# Patient Record
Sex: Male | Born: 1935 | Hispanic: Yes | Smoking: Former smoker
Health system: Southern US, Community
[De-identification: ages and names within clinical notes are randomized; demographics above are authoritative.]

## PROBLEM LIST (undated history)

## (undated) DIAGNOSIS — E119 Type 2 diabetes mellitus without complications: Secondary | ICD-10-CM

## (undated) DIAGNOSIS — I739 Peripheral vascular disease, unspecified: Secondary | ICD-10-CM

## (undated) DIAGNOSIS — I10 Essential (primary) hypertension: Secondary | ICD-10-CM

## (undated) DIAGNOSIS — F0390 Unspecified dementia without behavioral disturbance: Secondary | ICD-10-CM

## (undated) DIAGNOSIS — N189 Chronic kidney disease, unspecified: Secondary | ICD-10-CM

## (undated) HISTORY — PX: ABOVE KNEE LEG AMPUTATION: SUR20

## (undated) HISTORY — DX: Essential (primary) hypertension: I10

## (undated) HISTORY — DX: Unspecified dementia, unspecified severity, without behavioral disturbance, psychotic disturbance, mood disturbance, and anxiety: F03.90

## (undated) HISTORY — DX: Type 2 diabetes mellitus without complications: E11.9

## (undated) HISTORY — DX: Peripheral vascular disease, unspecified: I73.9

## (undated) HISTORY — DX: Chronic kidney disease, unspecified: N18.9

## (undated) HISTORY — PX: TRICEPS TENDON REPAIR: SHX2577

---

## 2005-08-21 ENCOUNTER — Ambulatory Visit: Payer: Self-pay | Admitting: Orthopaedic Surgery

## 2005-09-02 ENCOUNTER — Other Ambulatory Visit: Payer: Self-pay

## 2005-09-02 ENCOUNTER — Ambulatory Visit: Payer: Self-pay | Admitting: Orthopaedic Surgery

## 2005-09-07 ENCOUNTER — Ambulatory Visit: Payer: Self-pay | Admitting: Orthopaedic Surgery

## 2007-10-21 ENCOUNTER — Other Ambulatory Visit: Payer: Self-pay

## 2007-10-22 ENCOUNTER — Other Ambulatory Visit: Payer: Self-pay

## 2007-10-22 ENCOUNTER — Inpatient Hospital Stay: Payer: Self-pay | Admitting: Internal Medicine

## 2010-01-24 ENCOUNTER — Emergency Department: Payer: Self-pay | Admitting: Emergency Medicine

## 2011-01-05 ENCOUNTER — Encounter: Payer: Self-pay | Admitting: Family Medicine

## 2011-01-19 ENCOUNTER — Encounter: Payer: Self-pay | Admitting: Family Medicine

## 2011-02-19 ENCOUNTER — Encounter: Payer: Self-pay | Admitting: Family Medicine

## 2012-02-11 ENCOUNTER — Emergency Department: Payer: Self-pay | Admitting: *Deleted

## 2012-02-11 LAB — TROPONIN I: Troponin-I: 0.07 ng/mL — ABNORMAL HIGH

## 2012-02-11 LAB — CBC
HCT: 43.8 % (ref 40.0–52.0)
MCH: 30.2 pg (ref 26.0–34.0)
MCHC: 32.6 g/dL (ref 32.0–36.0)
Platelet: 205 10*3/uL (ref 150–440)
RBC: 4.73 10*6/uL (ref 4.40–5.90)
RDW: 13.5 % (ref 11.5–14.5)

## 2012-02-11 LAB — BASIC METABOLIC PANEL
Anion Gap: 11 (ref 7–16)
BUN: 26 mg/dL — ABNORMAL HIGH (ref 7–18)
Calcium, Total: 8.2 mg/dL — ABNORMAL LOW (ref 8.5–10.1)
Co2: 23 mmol/L (ref 21–32)
Creatinine: 2.1 mg/dL — ABNORMAL HIGH (ref 0.60–1.30)
EGFR (African American): 32 — ABNORMAL LOW
Osmolality: 286 (ref 275–301)
Sodium: 140 mmol/L (ref 136–145)

## 2012-02-11 LAB — CK TOTAL AND CKMB (NOT AT ARMC): CK-MB: 4.7 ng/mL — ABNORMAL HIGH (ref 0.5–3.6)

## 2013-10-04 ENCOUNTER — Ambulatory Visit: Payer: Self-pay | Admitting: Orthopedic Surgery

## 2013-10-25 ENCOUNTER — Emergency Department: Payer: Self-pay | Admitting: Emergency Medicine

## 2013-10-25 LAB — COMPREHENSIVE METABOLIC PANEL
ALBUMIN: 3.3 g/dL — AB (ref 3.4–5.0)
ANION GAP: 5 — AB (ref 7–16)
AST: 34 U/L (ref 15–37)
Alkaline Phosphatase: 74 U/L
BILIRUBIN TOTAL: 0.7 mg/dL (ref 0.2–1.0)
BUN: 43 mg/dL — ABNORMAL HIGH (ref 7–18)
CO2: 21 mmol/L (ref 21–32)
Calcium, Total: 8.7 mg/dL (ref 8.5–10.1)
Chloride: 114 mmol/L — ABNORMAL HIGH (ref 98–107)
Creatinine: 2.68 mg/dL — ABNORMAL HIGH (ref 0.60–1.30)
EGFR (Non-African Amer.): 20 — ABNORMAL LOW
GFR CALC AF AMER: 24 — AB
Glucose: 144 mg/dL — ABNORMAL HIGH (ref 65–99)
OSMOLALITY: 293 (ref 275–301)
Potassium: 4.7 mmol/L (ref 3.5–5.1)
SGPT (ALT): 40 U/L (ref 12–78)
Sodium: 140 mmol/L (ref 136–145)
TOTAL PROTEIN: 6.6 g/dL (ref 6.4–8.2)

## 2013-10-25 LAB — CBC
HCT: 39.3 % — AB (ref 40.0–52.0)
HGB: 13.1 g/dL (ref 13.0–18.0)
MCH: 31.4 pg (ref 26.0–34.0)
MCHC: 33.4 g/dL (ref 32.0–36.0)
MCV: 94 fL (ref 80–100)
PLATELETS: 189 10*3/uL (ref 150–440)
RBC: 4.19 10*6/uL — ABNORMAL LOW (ref 4.40–5.90)
RDW: 14.9 % — ABNORMAL HIGH (ref 11.5–14.5)
WBC: 7.7 10*3/uL (ref 3.8–10.6)

## 2013-10-25 LAB — CK TOTAL AND CKMB (NOT AT ARMC)
CK, TOTAL: 139 U/L
CK-MB: 5.6 ng/mL — ABNORMAL HIGH (ref 0.5–3.6)

## 2013-10-25 LAB — TROPONIN I: Troponin-I: 0.16 ng/mL — ABNORMAL HIGH

## 2013-10-25 LAB — APTT: Activated PTT: 27.8 secs (ref 23.6–35.9)

## 2013-10-25 LAB — PROTIME-INR
INR: 1.1
Prothrombin Time: 13.7 secs (ref 11.5–14.7)

## 2013-11-12 ENCOUNTER — Ambulatory Visit: Payer: Self-pay | Admitting: Vascular Surgery

## 2013-11-12 LAB — BASIC METABOLIC PANEL
Anion Gap: 2 — ABNORMAL LOW (ref 7–16)
BUN: 34 mg/dL — ABNORMAL HIGH (ref 7–18)
CALCIUM: 8.8 mg/dL (ref 8.5–10.1)
CO2: 28 mmol/L (ref 21–32)
CREATININE: 2.32 mg/dL — AB (ref 0.60–1.30)
Chloride: 116 mmol/L — ABNORMAL HIGH (ref 98–107)
EGFR (African American): 28 — ABNORMAL LOW
GFR CALC NON AF AMER: 24 — AB
GLUCOSE: 114 mg/dL — AB (ref 65–99)
Osmolality: 299 (ref 275–301)
POTASSIUM: 5.4 mmol/L — AB (ref 3.5–5.1)
SODIUM: 146 mmol/L — AB (ref 136–145)

## 2014-11-19 ENCOUNTER — Ambulatory Visit
Admit: 2014-11-19 | Disposition: A | Discharge status: Home / Self Care | Payer: Self-pay | Attending: Internal Medicine | Admitting: Internal Medicine

## 2014-12-12 ENCOUNTER — Inpatient Hospital Stay
Admit: 2014-12-12 | Disposition: A | Discharge status: Skilled Nursing Facility | Payer: Self-pay | Attending: Internal Medicine | Admitting: Internal Medicine

## 2014-12-12 LAB — CBC
HCT: 34.5 % — AB (ref 40.0–52.0)
HGB: 11.2 g/dL — AB (ref 13.0–18.0)
MCH: 30.5 pg (ref 26.0–34.0)
MCHC: 32.3 g/dL (ref 32.0–36.0)
MCV: 94 fL (ref 80–100)
Platelet: 309 10*3/uL (ref 150–440)
RBC: 3.66 10*6/uL — AB (ref 4.40–5.90)
RDW: 13.2 % (ref 11.5–14.5)
WBC: 9.5 10*3/uL (ref 3.8–10.6)

## 2014-12-12 LAB — LACTIC ACID, PLASMA: Lactic Acid, Venous: 1.2 mmol/L

## 2014-12-12 LAB — COMPREHENSIVE METABOLIC PANEL
ALBUMIN: 3.2 g/dL — AB
ALK PHOS: 71 U/L
ALT: 15 U/L — AB
Anion Gap: 8 (ref 7–16)
BILIRUBIN TOTAL: 0.5 mg/dL
BUN: 61 mg/dL — AB
CHLORIDE: 116 mmol/L — AB
CREATININE: 2.71 mg/dL — AB
Calcium, Total: 8.7 mg/dL — ABNORMAL LOW
Co2: 19 mmol/L — ABNORMAL LOW
EGFR (African American): 23 — ABNORMAL LOW
EGFR (Non-African Amer.): 20 — ABNORMAL LOW
Glucose: 126 mg/dL — ABNORMAL HIGH
Potassium: 5.2 mmol/L — ABNORMAL HIGH
SGOT(AST): 20 U/L
Sodium: 143 mmol/L
TOTAL PROTEIN: 6.5 g/dL

## 2014-12-13 LAB — BASIC METABOLIC PANEL
Anion Gap: 5 — ABNORMAL LOW (ref 7–16)
Anion Gap: 6 — ABNORMAL LOW (ref 7–16)
BUN: 56 mg/dL — ABNORMAL HIGH
BUN: 52 mg/dL — ABNORMAL HIGH
CALCIUM: 8.7 mg/dL — AB
CALCIUM: 8.5 mg/dL — AB
CHLORIDE: 117 mmol/L — AB
CHLORIDE: 115 mmol/L — AB
CREATININE: 2.67 mg/dL — AB
CREATININE: 2.47 mg/dL — AB
Co2: 23 mmol/L
Co2: 22 mmol/L
EGFR (African American): 24 — ABNORMAL LOW
EGFR (African American): 26 — ABNORMAL LOW
EGFR (Non-African Amer.): 20 — ABNORMAL LOW
EGFR (Non-African Amer.): 22 — ABNORMAL LOW
Glucose: 104 mg/dL — ABNORMAL HIGH
Glucose: 112 mg/dL — ABNORMAL HIGH
Potassium: 5.3 mmol/L — ABNORMAL HIGH
Potassium: 5.2 mmol/L — ABNORMAL HIGH
Sodium: 145 mmol/L
Sodium: 143 mmol/L

## 2014-12-13 LAB — CK: CK, TOTAL: 158 U/L

## 2014-12-13 LAB — HEMOGLOBIN: HGB: 11.3 g/dL — AB (ref 13.0–18.0)

## 2014-12-14 LAB — CBC WITH DIFFERENTIAL/PLATELET
Basophil #: 0 10*3/uL (ref 0.0–0.1)
Basophil %: 0.4 %
Eosinophil #: 0 10*3/uL (ref 0.0–0.7)
Eosinophil %: 0.2 %
HCT: 35.8 % — AB (ref 40.0–52.0)
HGB: 11.7 g/dL — AB (ref 13.0–18.0)
LYMPHS PCT: 13.2 %
Lymphocyte #: 1.3 10*3/uL (ref 1.0–3.6)
MCH: 30.8 pg (ref 26.0–34.0)
MCHC: 32.8 g/dL (ref 32.0–36.0)
MCV: 94 fL (ref 80–100)
MONOS PCT: 6.4 %
Monocyte #: 0.6 x10 3/mm (ref 0.2–1.0)
NEUTROS PCT: 79.8 %
Neutrophil #: 7.9 10*3/uL — ABNORMAL HIGH (ref 1.4–6.5)
PLATELETS: 274 10*3/uL (ref 150–440)
RBC: 3.81 10*6/uL — ABNORMAL LOW (ref 4.40–5.90)
RDW: 13.2 % (ref 11.5–14.5)
WBC: 9.9 10*3/uL (ref 3.8–10.6)

## 2014-12-14 LAB — BASIC METABOLIC PANEL
ANION GAP: 9 (ref 7–16)
BUN: 52 mg/dL — AB
Calcium, Total: 8.8 mg/dL — ABNORMAL LOW
Chloride: 114 mmol/L — ABNORMAL HIGH
Co2: 22 mmol/L
Glucose: 130 mg/dL — ABNORMAL HIGH
Potassium: 5.2 mmol/L — ABNORMAL HIGH
Sodium: 145 mmol/L

## 2014-12-14 LAB — CREATININE, SERUM
Creatinine: 2.32 mg/dL — ABNORMAL HIGH
GFR CALC AF AMER: 28 — AB
GFR CALC NON AF AMER: 24 — AB

## 2014-12-15 LAB — CO2, TOTAL: Co2: 24 mmol/L

## 2014-12-15 LAB — POTASSIUM: Potassium: 4.4 mmol/L

## 2014-12-15 LAB — CLOSTRIDIUM DIFFICILE(ARMC)

## 2014-12-15 LAB — CREATININE, SERUM
Creatinine: 2.34 mg/dL — ABNORMAL HIGH
EGFR (African American): 28 — ABNORMAL LOW
EGFR (Non-African Amer.): 24 — ABNORMAL LOW

## 2014-12-16 LAB — BASIC METABOLIC PANEL
ANION GAP: 11 (ref 7–16)
BUN: 48 mg/dL — ABNORMAL HIGH
CALCIUM: 8.4 mg/dL — AB
CHLORIDE: 116 mmol/L — AB
Co2: 25 mmol/L
Creatinine: 2.26 mg/dL — ABNORMAL HIGH
EGFR (African American): 29 — ABNORMAL LOW
EGFR (Non-African Amer.): 25 — ABNORMAL LOW
GLUCOSE: 127 mg/dL — AB
Potassium: 3.7 mmol/L
SODIUM: 152 mmol/L — AB

## 2014-12-16 LAB — VANCOMYCIN, TROUGH: Vancomycin, Trough: 8 ug/mL — ABNORMAL LOW

## 2014-12-17 LAB — BASIC METABOLIC PANEL
Anion Gap: 8 (ref 7–16)
BUN: 46 mg/dL — ABNORMAL HIGH
Calcium, Total: 8.1 mg/dL — ABNORMAL LOW
Chloride: 112 mmol/L — ABNORMAL HIGH
Co2: 25 mmol/L
Creatinine: 2.22 mg/dL — ABNORMAL HIGH
EGFR (African American): 30 — ABNORMAL LOW
EGFR (Non-African Amer.): 26 — ABNORMAL LOW
Glucose: 117 mg/dL — ABNORMAL HIGH
Potassium: 3.8 mmol/L
SODIUM: 145 mmol/L

## 2014-12-17 LAB — CULTURE, BLOOD (SINGLE)

## 2014-12-18 LAB — BASIC METABOLIC PANEL
Anion Gap: 10 (ref 7–16)
BUN: 45 mg/dL — AB
CHLORIDE: 112 mmol/L — AB
CREATININE: 2.25 mg/dL — AB
Calcium, Total: 8 mg/dL — ABNORMAL LOW
Co2: 24 mmol/L
GFR CALC AF AMER: 29 — AB
GFR CALC NON AF AMER: 25 — AB
GLUCOSE: 108 mg/dL — AB
Potassium: 4 mmol/L
Sodium: 146 mmol/L — ABNORMAL HIGH

## 2014-12-18 LAB — VANCOMYCIN, TROUGH: VANCOMYCIN, TROUGH: 14 ug/mL

## 2014-12-18 LAB — PLATELET COUNT: Platelet: 266 10*3/uL (ref 150–440)

## 2014-12-20 ENCOUNTER — Ambulatory Visit
Admit: 2014-12-20 | Disposition: A | Discharge status: Home / Self Care | Payer: Self-pay | Attending: Internal Medicine | Admitting: Internal Medicine

## 2014-12-20 LAB — BASIC METABOLIC PANEL
ANION GAP: 8 (ref 7–16)
BUN: 44 mg/dL — ABNORMAL HIGH
CALCIUM: 8.3 mg/dL — AB
CHLORIDE: 117 mmol/L — AB
Co2: 23 mmol/L
Creatinine: 2.08 mg/dL — ABNORMAL HIGH
GFR CALC AF AMER: 32 — AB
GFR CALC NON AF AMER: 28 — AB
Glucose: 124 mg/dL — ABNORMAL HIGH
Potassium: 4.5 mmol/L
SODIUM: 148 mmol/L — AB

## 2014-12-20 LAB — CBC WITH DIFFERENTIAL/PLATELET
BASOS ABS: 0 10*3/uL (ref 0.0–0.1)
Basophil %: 0.4 %
Eosinophil #: 0.1 10*3/uL (ref 0.0–0.7)
Eosinophil %: 0.7 %
HCT: 36.6 % — AB (ref 40.0–52.0)
HGB: 11.9 g/dL — ABNORMAL LOW (ref 13.0–18.0)
Lymphocyte #: 1.2 10*3/uL (ref 1.0–3.6)
Lymphocyte %: 10.5 %
MCH: 30.6 pg (ref 26.0–34.0)
MCHC: 32.6 g/dL (ref 32.0–36.0)
MCV: 94 fL (ref 80–100)
MONOS PCT: 6.5 %
Monocyte #: 0.8 x10 3/mm (ref 0.2–1.0)
NEUTROS ABS: 9.8 10*3/uL — AB (ref 1.4–6.5)
Neutrophil %: 81.9 %
Platelet: 264 10*3/uL (ref 150–440)
RBC: 3.9 10*6/uL — ABNORMAL LOW (ref 4.40–5.90)
RDW: 12.8 % (ref 11.5–14.5)
WBC: 11.9 10*3/uL — AB (ref 3.8–10.6)

## 2014-12-20 LAB — VANCOMYCIN, TROUGH: Vancomycin, Trough: 20 ug/mL

## 2014-12-21 LAB — CBC WITH DIFFERENTIAL/PLATELET
BASOS ABS: 0.1 10*3/uL (ref 0.0–0.1)
Basophil %: 0.4 %
Eosinophil #: 0 10*3/uL (ref 0.0–0.7)
Eosinophil %: 0.2 %
HCT: 35.7 % — ABNORMAL LOW (ref 40.0–52.0)
HGB: 11.4 g/dL — ABNORMAL LOW (ref 13.0–18.0)
LYMPHS ABS: 1.1 10*3/uL (ref 1.0–3.6)
LYMPHS PCT: 7 %
MCH: 30.3 pg (ref 26.0–34.0)
MCHC: 32 g/dL (ref 32.0–36.0)
MCV: 95 fL (ref 80–100)
MONOS PCT: 6.1 %
Monocyte #: 0.9 x10 3/mm (ref 0.2–1.0)
Neutrophil #: 13.3 10*3/uL — ABNORMAL HIGH (ref 1.4–6.5)
Neutrophil %: 86.3 %
Platelet: 264 10*3/uL (ref 150–440)
RBC: 3.76 10*6/uL — ABNORMAL LOW (ref 4.40–5.90)
RDW: 13 % (ref 11.5–14.5)
WBC: 15.4 10*3/uL — AB (ref 3.8–10.6)

## 2014-12-21 LAB — BASIC METABOLIC PANEL
Anion Gap: 5 — ABNORMAL LOW (ref 7–16)
BUN: 54 mg/dL — ABNORMAL HIGH
CALCIUM: 8 mg/dL — AB
CHLORIDE: 120 mmol/L — AB
Co2: 23 mmol/L
Creatinine: 2.39 mg/dL — ABNORMAL HIGH
GFR CALC AF AMER: 27 — AB
GFR CALC NON AF AMER: 23 — AB
Glucose: 132 mg/dL — ABNORMAL HIGH
Potassium: 4.9 mmol/L
Sodium: 148 mmol/L — ABNORMAL HIGH

## 2014-12-22 LAB — CBC WITH DIFFERENTIAL/PLATELET
BASOS ABS: 0.1 10*3/uL (ref 0.0–0.1)
Basophil %: 0.5 %
Eosinophil #: 0.1 10*3/uL (ref 0.0–0.7)
Eosinophil %: 0.7 %
HCT: 32.6 % — ABNORMAL LOW (ref 40.0–52.0)
HGB: 10.7 g/dL — AB (ref 13.0–18.0)
LYMPHS PCT: 9.4 %
Lymphocyte #: 1.2 10*3/uL (ref 1.0–3.6)
MCH: 31 pg (ref 26.0–34.0)
MCHC: 33 g/dL (ref 32.0–36.0)
MCV: 94 fL (ref 80–100)
MONOS PCT: 6.8 %
Monocyte #: 0.8 x10 3/mm (ref 0.2–1.0)
NEUTROS ABS: 10.1 10*3/uL — AB (ref 1.4–6.5)
Neutrophil %: 82.6 %
Platelet: 232 10*3/uL (ref 150–440)
RBC: 3.46 10*6/uL — ABNORMAL LOW (ref 4.40–5.90)
RDW: 12.9 % (ref 11.5–14.5)
WBC: 12.2 10*3/uL — AB (ref 3.8–10.6)

## 2014-12-22 LAB — BASIC METABOLIC PANEL
ANION GAP: 4 — AB (ref 7–16)
BUN: 62 mg/dL — ABNORMAL HIGH
CALCIUM: 7.7 mg/dL — AB
CREATININE: 2.42 mg/dL — AB
Chloride: 116 mmol/L — ABNORMAL HIGH
Co2: 24 mmol/L
GFR CALC AF AMER: 27 — AB
GFR CALC NON AF AMER: 23 — AB
Glucose: 163 mg/dL — ABNORMAL HIGH
POTASSIUM: 4.7 mmol/L
Sodium: 144 mmol/L

## 2014-12-23 LAB — URINALYSIS, COMPLETE
Bilirubin,UR: NEGATIVE
Glucose,UR: NEGATIVE mg/dL (ref 0–75)
Ketone: NEGATIVE
LEUKOCYTE ESTERASE: NEGATIVE
Nitrite: NEGATIVE
Ph: 5 (ref 4.5–8.0)
Protein: 30
SPECIFIC GRAVITY: 1.017 (ref 1.003–1.030)
WBC UR: 23 /HPF (ref 0–5)

## 2014-12-24 LAB — BASIC METABOLIC PANEL
Anion Gap: 5 — ABNORMAL LOW (ref 7–16)
BUN: 54 mg/dL — AB
CHLORIDE: 119 mmol/L — AB
CREATININE: 2.02 mg/dL — AB
Calcium, Total: 7.6 mg/dL — ABNORMAL LOW
Co2: 23 mmol/L
EGFR (African American): 33 — ABNORMAL LOW
GFR CALC NON AF AMER: 29 — AB
Glucose: 138 mg/dL — ABNORMAL HIGH
Potassium: 4.8 mmol/L
Sodium: 147 mmol/L — ABNORMAL HIGH

## 2014-12-24 LAB — HEMOGLOBIN: HGB: 9.4 g/dL — AB (ref 13.0–18.0)

## 2014-12-24 LAB — WBC: WBC: 8.6 10*3/uL (ref 3.8–10.6)

## 2014-12-24 LAB — URINE CULTURE

## 2014-12-25 LAB — BASIC METABOLIC PANEL
Anion Gap: 5 — ABNORMAL LOW (ref 7–16)
BUN: 48 mg/dL — AB
CHLORIDE: 112 mmol/L — AB
Calcium, Total: 7.5 mg/dL — ABNORMAL LOW
Co2: 22 mmol/L
Creatinine: 1.8 mg/dL — ABNORMAL HIGH
EGFR (Non-African Amer.): 33 — ABNORMAL LOW
GFR CALC AF AMER: 38 — AB
GLUCOSE: 108 mg/dL — AB
Potassium: 4.7 mmol/L
Sodium: 139 mmol/L

## 2015-01-11 NOTE — Op Note (Signed)
PATIENT NAME:  Jack French, Jack French MR#:  [id number] DATE OF BIRTH:  1924-03-22  DATE OF PROCEDURE:  11/12/2013  PREOPERATIVE DIAGNOSES:  1.  Peripheral arterial disease with claudication and recurrent infection, left lower extremity.  2.  Chronic kidney disease.  3.  Coronary disease and congestive heart failure.  4.  Hypertension.   POSTOPERATIVE DIAGNOSES: 1.  Peripheral arterial disease with claudication and recurrent infection, left lower extremity.  2.  Chronic kidney disease.  3.  Coronary disease and congestive heart failure.  4.  Hypertension.   PROCEDURES:  1.  Ultrasound guidance for vascular access, right femoral artery.  2.  Catheter placement to left anterior tibial artery from right femoral approach.  3.  Aortogram and selective left lower extremity angiogram.  4.  Percutaneous transluminal angioplasty of proximal and mid superficial femoral artery with 5 mm diameter angioplasty balloon.  5.  Percutaneous transluminal angioplasty of distal superficial femoral artery and popliteal artery for a separate distinct occlusion with 5 mm diameter angioplasty balloon.  6.  Percutaneous transluminal angioplasty of left anterior tibial artery with 3 mm diameter angioplasty balloon.  7.  Self-expanding stent placement to distal left superficial femoral artery and popliteal artery for greater than 50% residual stenosis after angioplasty.  8.  StarClose closure device, right femoral artery.   SURGEON: Festus BarrenJason Edra Riccardi, M.D.   ANESTHESIA: Local with moderate conscious sedation.   ESTIMATED BLOOD LOSS: 25 mL.  FLUOROSCOPY TIME: Approximately 15 minutes.  CONTRAST USED: 80 mL.   INDICATION FOR PROCEDURE: This is an 81 year old Hispanic male with multiple medical comorbidities. He presented to the office with pain in the left leg worsening with activity and significantly reduced ABIs and waveforms consistent with significant arterial insufficiency, He has recurrent infections of the left lower  extremity making this a limb threatening situation, more than just the pain with activity. For these reasons, angiography was discussed for attempt at revascularization to improve his flow. Risks and benefits were discussed. Informed consent was obtained.   DESCRIPTION OF PROCEDURE: The patient is brought to the vascular interventional radiology suite. Groins were shaved and prepped and a sterile surgical field was created. The right femoral artery was visualized with ultrasound and accessed under direct ultrasound guidance without difficulty with a Seldinger needle. A J-wire was then placed and a 5-French sheath was placed. Pigtail catheter was placed in the aorta at the L1 level and AP aortogram was performed. This showed significant tortuosity without stenosis in the aorta and iliac segments. The renal arteries were patent. I crossed the aortic bifurcation with the rim catheter and the Terumo advantage wire and advanced to the left femoral head. Selective left lower extremity angiogram was then performed. This showed a diffusely diseased left lower extremity. He had a proximal high-grade stenosis about 5 cm beyond the origin of the SFA, another moderate stenosis in the mid SFA and then the distal SFA occluded. The above-knee popliteal artery reconstituted, but then re-occluded below the knee and we had very poor runoff distally. It was hard to evaluate due to the patient's continued motion and inability to cooperate throughout the entire case. The patient was systemically heparinized. A 6-French Ansell sheath was placed over a Terumo advantage wire again crossing the occlusion and was able to cross the occlusion and gain intraluminal flow in the anterior tibial artery and confirming intraluminal flow in a diseased anterior tibial artery proximally, but a patent anterior tibial artery beyond the first 8 to 10 cm, which was then continuous to the  foot. Initially, I used a 3 mm diameter angioplasty balloon  throughout the popliteal occlusion in the anterior tibial artery, but could not get beyond the anterior tibial artery proximally due to the very dense calcific lesion. The 5 mm diameter angioplasty balloon was then inflated from the below-knee popliteal artery to the distal popliteal artery encompassing the area of occlusion. It was inflated in the mid to proximal superficial femoral artery for the separate and distinct lesions more proximally. Completion angiogram, which was difficult to interpret due to the poor image quality due to the patient's motion, showed the proximal and mid SFA areas to be reasonably well opened, but the distal SFA and above-knee popliteal artery had a very high-grade residual stenosis after angioplasty.   I treated this lesion with a 6 mm diameter self-expanding stent. I also took a 3 mm diameter angioplasty balloon further into the anterior tibial artery, crossing the entire lesion and opening this vessel, although it was difficult to image at the end due to motion. At this point, I felt I had done all I could do today with the patient's intolerance and his flow was significantly improved from his baseline. StarClose closure device was deployed in the usual fashion with excellent hemostatic result. The patient tolerated the procedure well and was taken to the recovery room in stable condition.   ____________________________ Annice Needy, MD jsd:aw D: 11/12/2013 11:33:43 ET T: 11/12/2013 11:57:14 ET JOB#: [id number]  cc: Annice Needy, MD, <Dictator> Annice Needy MD ELECTRONICALLY SIGNED 11/15/2013 12:02

## 2015-01-13 LAB — SURGICAL PATHOLOGY

## 2015-01-19 NOTE — Discharge Summary (Signed)
PATIENT NAME:  Jack French, Jack French MR#:  [id number] DATE OF BIRTH:  1923/10/17  DATE OF ADMISSION:  12/12/2014 DATE OF DISCHARGE:  12/25/2014  ADMITTING DIAGNOSES: Gangrenous left toes.   DISCHARGE DIAGNOSES: 1. Severe peripheral arterial disease with left forefoot gangrene status post arthrogram, as well as arteriogram of the left lower extremity on the 12/13/2014, by Dr. 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 Gilda Crease.  2. Acute on chronic renal failure.  3. Chronic kidney disease stage III.  4. Hyperkalemia due to acute on chronic renal failure.  5. Hyponatremia.  6. Dehydration.  7. Dementia with agitation and dysphagia.  8. Failure to thrive, adult.  9. Sterile pyuria.  10. History of hypertension. 11. Coronary artery disease status post coronary artery bypass grafting.  12. Permanent pacemaker placement in the remote past.  13. Hyperlipidemia.  14. Diabetes mellitus, diet controlled.  15. Peripheral arterial disease, status post stent placement in the remote past.  16. History of stroke.   DISCHARGE CONDITION: Stable, guarded.   MEDICATIONS: The patient is to continue carvedilol 12.5 mg twice daily, clonazepam 1 mg once daily at 5 p.m., acetaminophen/hydrocodone 325 mg/5 mg 1 tablet every 4 hours as needed, dronabinol 2.5 mg twice daily, Colace 100 mg twice daily, Haldol 1 mg twice daily as needed. The patient is not to take lisinopril, furosemide, acetaminophen, or tramadol unless recommended by primary care physician.   HOME OXYGEN: None.   DIET: Low-salt, low-fat, low-cholesterol, carbohydrate-controlled diet, pureed and nectar-thick liquids with strict aspiration precautions. Medications should be given in puree. Feeding assistance with all meals. Dietary drink supplements as ordered. Moisten foods with condiments and flavor.   ACTIVITY LIMITATIONS: As tolerated.   REFERRALS: To physical therapy, as well as speech therapist  evaluation.    FOLLOWUP APPOINTMENTS: With Dr. Terance Hart in 2 days after discharge, and with Dr. Gilda Crease in 1 week after discharge.   CONSULTANTS: Care management, social work, physical therapy, Dr. Festus Barren, Dr. Levora Dredge, Dr. Harriett Sine Phifer, Tonie Griffith, NP, Dr. Harold Hedge.    RADIOLOGIC STUDIES:  Left foot complete x-ray, 12/12/2014, revealing some degree of lateral subluxation at the third metatarsophalangeal joint with underlying apparent osseus erosion at the base of the third proximal phalanx raising concern of osteomyelitis. Evaluation for osteomyelitis is limited on the radiograph. Known soft tissue necrosis is not well categorized on radiograph. Os peroneum noted, diffuse vascular calcification seen.   Chest x-ray, portable single view, 12/22/2014: Showed a mild bibasilar opacification, hypoventilation and symmetry favors atelectasis over pneumonia.   HISTORY OF PRESENT ILLNESS AND HOSPITAL COURSE: The patient is an 81 year old, Spanish male, with past medical history significant for history of hypertension, coronary artery disease, hyperlipidemia, diabetes mellitus, who presents to the hospital with complaints of black left foot toes. Please refer to Dr. Larose Hires admission note on the 12/12/2014, as well as interim discharge summary dictated on by Dr. Cherlynn Kaiser on 12/22/2014. The patient was admitted to the hospital with black left foot toes. He was consulted by Dr. Gilda Crease, who felt the patient had left foot gangrene and severe peripheral arterial disease. Dr. Gilda Crease recommended cardiologic evaluation. Dr. Lady Gary saw the patient in consultation and felt that the patient should be continued on beta blockers, but he was not a candidate for further invasive or noninvasive cardiac work-up. He felt that the patient is moderate risk for surgery and felt that the patient should proceed to surgery if needed. That was discussed with the patient's family, and  they generally agreed to  proceed.   The patient had an abdominal aortogram on 12/13/2014 with left lower extremity distal runoff, third order catheter placement, which revealed gangrenous changes of the left lower extremity and the patient's history of extensive atherosclerotic occlusive disease. According to Dr. Gilda CreaseSchnier, he did not have a pattern of disease that is reconstructible either by intervention or even by surgery; therefore, he recommended above-knee amputation. This was discussed with his family and palliative care. The patient's family decided to proceed. The patient underwent above knee amputation on 12/20/2014.   Postoperatively, he was agitated intermittently and had poor oral intake; however, his condition relatively improved, and on 12/25/2014, he was felt to be stable to be discharged to a skilled nursing facility for management of his chronic medical problems.   VITAL SIGNS: On the day of discharge, temperature was 98, pulse was 88, respiration was 18, blood pressure 104/54, saturation was 94% to 96% on room air at rest.    The patient was noted to have renal insufficiency after the vascular procedure. Initial creatinine on arrival to the hospital, 12/12/2014, was 2.71, potassium was 5.1, bicarbonate level was only 19. With therapy, his kidney function improved to a creatinine level of 2.32 on 12/14/2014, and 2.26 on 12/16/2014. He was given IV fluids and his kidney function overall improved. On 12/24/2014, the patient's creatinine was 2.02.   Unfortunately, the patient had poor p.o. intake leading him to some dehydration and hypernatremia, sodium level was 148 on 12/21/2014. He was given D5 water with which his sodium level improved. He was also noted to have leukocytosis with white blood cell count as high as 15.4 on 12/21/2014; however, with therapy, his white blood cell count normalized. He was also noted to be anemic and his hemoglobin level was found to be 9.4 on 12/24/2014.   It is recommended to  closely follow the patient's kidney function and refer him to a kidney doctor if needed. Also, follow him for dehydration, refer to palliative care, as well as hospice care if his p.o. intake does not improve. It is recommended also to follow the patient's hemoglobin level and make decisions about iron supplementation, if needed.   The patient is being discharged to a skilled nursing facility for further treatment of his chronic medical problems.   Of note, he was also noted to have dysphagia, having some difficulty with coughing with oral intake and had an episode of elevated temperatures to 100.5 on 12/22/2014, which resulted in chest x-ray. Chest x-ray, however, did not show any pneumonia, but mild diffuse opacifications, as well as hypoventilation. The patient was not initiated on any antibiotics, though, and his fevers resolved.   DISCHARGE CONDITION: Overall, the patient's condition is poor due to his multiple medical issues, as well as dementia, which precludes from further advancement of improvement of his condition.   TIME SPENT: 50 minutes.   ____________________________ Katharina Caperima Jaquarius Seder, MD rv:JT D: 12/25/2014 08:12:11 ET T: 12/25/2014 09:27:43 ET JOB#: [id number]  cc: Katharina Caperima Yomayra Tate, MD, <Dictator> Teena Iraniavid M. Terance HartBronstein, MD Renford DillsGregory G. Schnier, MD Mayah Urquidi Winona LegatoVAICKUTE MD ELECTRONICALLY SIGNED 12/29/2014 16:10

## 2015-01-19 NOTE — Consult Note (Signed)
   Present Illness 81 yo male with history  of cad s/p cabg in 1996 with moderate ischemic cardiomyopathy with ef of 30% in 2013 with chronic nyha class 3 chf, history of second degree heart block s/p placement of ppm , history of pvd s/p stenting in the left lower extremety, history of ckd with gfr of 25-30, history of dementia who was admitted with gangrenous lower extremety. He is a very difficult historian due to dementia. He has a mildly elevated serum troponin at 0.16. He is hemodynamically stable. He has been off of his plavix per chart. He is on emperic antibiotics. He is on lisinopril at appears to have been taiing this at home.   Physical Exam:  GEN disheveled   HEENT poor dentition   NECK No masses   RESP no use of accessory muscles   CARD Regular rate and rhythm  Murmur   Murmur Systolic   Systolic Murmur axilla   ABD denies tenderness   LYMPH negative neck   EXTR negative cyanosis/clubbing, negative edema   SKIN positive ulcers, gangrenous left foot   PSYCH poor insight   Review of Systems:  Subjective/Chief Complaint not able to give history   ROS Pt not able to provide ROS   Medications/Allergies Reviewed Medications/Allergies reviewed   EKG:  EKG NSR    PCN: Unknown   Impression 81 yo male with history  of cad s/p cabg in 1996 with moderate ischemic cardiomyopathy with ef of 30% in 2013 with chronic nyha class 3 chf, history of second degree heart block s/p placement of ppm , history of pvd s/p stenting in the left lower extremety, history of ckd with gfr of 25-30, history of dementia who was admitted with gangrenous left foot. Has mild troponin elevation. Likely demand ischemia. Not candidate for invasive evaluation. EF is mod reduced at 30%. WIll repeat echo to furthe4r evaluate lv funciton. If surgery needed, pt is at moderate risk for reduced ef. Would not proceed with funcitonal study at present as pt is not candidate for invasive cardiac evaluation at  present. Will discontinue lisinopril and place back on metoprolol   Plan 1. Discontinue lisinopril and start metoprolol 25 mg bid 2. Echo to evluatelv funciton 3. Moderate risk for surgery but appears optimized as well as possible. Not candidate for cardiac cath. 4. Agree with podiatric and vascular evaluation   Electronic Signatures: Dalia HeadingFath, Shrihaan Porzio A (MD)  (Signed 25-Mar-16 12:05)  Authored: General Aspect/Present Illness, History and Physical Exam, Review of System, EKG , Allergies, Impression/Plan   Last Updated: 25-Mar-16 12:05 by Dalia HeadingFath, Tajah Noguchi A (MD)

## 2015-01-19 NOTE — Consult Note (Signed)
Brief Consult Note: Diagnosis: ASO with gangrene of left foot.   Patient was seen by consultant.   Recommend to proceed with surgery or procedure.   Orders entered.   Comments: will angio today to see if intervention is feasible and if not to assess for level of amputation.  Electronic Signatures: Levora DredgeSchnier, Charae Depaolis (MD)  (Signed 25-Mar-16 20:04)  Authored: Brief Consult Note   Last Updated: 25-Mar-16 20:04 by Levora DredgeSchnier, Hiep Ollis (MD)

## 2015-01-19 NOTE — Op Note (Signed)
PATIENT NAME:  Jack French, Jack French MR#:  [id number] DATE OF BIRTH:  1924-07-12  DATE OF PROCEDURE:  12/13/2014  PREOPERATIVE DIAGNOSES:  1.  Gangrene of the left foot.  2.  Atherosclerotic occlusive disease, bilateral lower extremities.  3.  Advanced dementia.  4.  Chronic renal insufficiency, stage IV.  POSTOPERATIVE DIAGNOSES: 1.  Gangrene of the left foot.  2.  Atherosclerotic occlusive disease, bilateral lower extremities.  3.  Advanced dementia. 4.  Chronic renal insufficiency, stage IV.  PROCEDURES PERFORMED:  1.  Abdominal aortogram.  2.  Left lower extremity distal runoff, third order catheter placement.  Chronic renal insufficiency.   SURGEON: Renford DillsGregory French. Schnier, MD   SEDATION: Versed plus fentanyl.   CONTRAST: Isovue 37 mL.   FLUOROSCOPY TIME: 4.2 minutes.   ACCESS: A 5 French sheath, right superficial femoral artery.   INDICATIONS: Jack French is an 81 year old gentleman with advanced dementia who was admitted to the hospital with severe gangrenous changes of the entire forefoot. Risks and benefits for angiography in preparation for amputation and/or limb salvage are reviewed. Family asked us to proceed.   DESCRIPTION OF PROCEDURE: The patient is taken to special procedures and placed in the supine position. After adequate sedation is achieved, both groins are prepped and draped in sterile fashion. Access to the right common femoral is obtained. Catheter is advanced to the proximal aorta and AP projection of the aorta is obtained. RAO projection of the pelvis is obtained. The catheter is then used to cross the bifurcation. The catheter is advanced first to the distal external and then into the SFA. Distal imaging is obtained. After review of the images, the catheter was pulled back into the right, side oblique view is obtained, and a Mynx device is deployed.   INTERPRETATION: The aorta, bilateral common and external iliacs, as well as left common femoral and profunda  femoris are diffusely diseased but widely patent. SFA demonstrates diffuse subtotal occlusive disease, and [facility] total occlusion. There is stenting [facility], the popliteal and extending, it appears to be, into the tibioperoneal trunk; however, there is absolutely no reconstitution of any tibial vessel all the way through the calf and down to the foot. This is non-reconstructible situation.   SUMMARY: Gangrenous changes of the left lower extremity in a patient with a history of extensive atherosclerotic occlusive disease. At this time, he does not have a pattern of disease that is reconstructible either by intervention or even by surgery. Therefore, I would recommend an above-knee amputation and this will be discussed with family.    ____________________________ Renford DillsGregory French. Schnier, MD ggs:bm D: 12/13/2014 17:50:23 ET T: 12/14/2014 02:33:09 ET JOB#: [id number]  cc: Renford DillsGregory French. Schnier, MD, <Dictator> Renford DillsGREGORY French SCHNIER MD ELECTRONICALLY SIGNED 12/31/2014 15:12

## 2015-01-19 NOTE — Op Note (Signed)
PATIENT NAME:  Jack French, [name] MR#:  [id number] DATE OF BIRTH:  26-Apr-1924  DATE OF PROCEDURE:  12/20/2014  PREOPERATIVE DIAGNOSIS: Atherosclerotic occlusive disease, bilateral lower extremities, with gangrene of the left forefoot.   POSTOPERATIVE DIAGNOSIS:  Atherosclerotic occlusive disease, bilateral lower extremities, with gangrene of the left forefoot.   PROCEDURE PERFORMED:   Left above-knee amputation.   SURGEON: Renford DillsGregory G. Kimberleigh Mehan, MD   ANESTHESIA: General by LMA.   FLUIDS: Per anesthesia record.   ESTIMATED BLOOD LOSS: 100 mL.   SPECIMEN: Distal left lower extremity to pathology for permanent section.   INDICATIONS: The patient is an 81 year old gentleman with multiple medical problems including advanced dementia and nonreconstructible atherosclerotic occlusive disease. He is undergoing amputation of his left lower extremity for palliation from pain and infection. Risks and benefits are reviewed with the family. All are in agreement with proceeding.   DESCRIPTION OF PROCEDURE: The patient is taken to the operating room and placed in the supine position. After adequate general anesthesia is induced, appropriate invasive monitors are placed, he is positioned supine, and his left leg is prepped and draped circumferentially.   Umbilical tape is used to mark the incision on the skin. Appropriate timeout is called.   [place] incision is then created using a scalpel, carried down through the subcutaneous tissues incising the fascia. Muscle bellies are then transected with Bovie cautery.  The superficial femoral artery, vein, and then femoral nerve are identified and individually ligated and divided. Periosteum is then raised around the femur and Gigli saw is used to transect the femur. Amputation knife is used to transect posterior muscle bellies. Hemostats are then placed for hemostasis.   Zero Vicryl is then used to ligate all the vessels isolated with a hemostat and the sciatic  nerve is identified, retracted, and then highly ligated and divided. The leg is then irrigated with a liter of saline after rasp is used to smooth the edges of the femur. The fascia is then closed with 0 Vicryl. The skin is closed with staples and a sterile dressing is applied. The patient tolerated the procedure well and there were no immediate complications.    ____________________________ Renford DillsGregory G. Ridgely Anastacio, MD ggs:tr D: 12/20/2014 15:23:09 ET T: 12/20/2014 16:03:25 ET JOB#: [id number]  cc: Renford DillsGregory G. Neisha Hinger, MD, <Dictator> Renford DillsGREGORY G Valkyrie Guardiola MD ELECTRONICALLY SIGNED 12/31/2014 15:12

## 2015-01-19 NOTE — H&P (Signed)
PATIENT NAME:  Jack French, Jack French MR#:  [id number] DATE OF BIRTH:  Sep 21, 1923  DATE OF ADMISSION:  12/12/2014  PRIMARY CARE PHYSICIAN: Teena Irani. Terance Hart, MD   REFERRING EMERGENCY ROOM PHYSICIAN: Gladstone Pih, MD .   PRIMARY CARDIOLOGIST:  Marcina Millard, MD   PRIMARY PODIATRIST: Argentina Donovan. Ether Griffins, DPM   CHIEF COMPLAINT: Blackening of the toe.   HISTORY OF PRESENT ILLNESS: An 81 year old male who has a history of:  1. Hypertension.  2. Coronary artery disease, status post bypass surgery and pacemaker placement.  3. 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  4. Hyperlipidemia.  5. Renal insufficiency with baseline creatinine around 2.5.  6. Non-insulin-dependent diabetes, diet controlled.  7. Peripheral vascular disease and stent placement in the past.  8. Mild stroke.   He lives at home with his daughter and son-in-law for his circulation issues and foot issues, he visits podiatry doctor. He saw Dr. Orland Jarred 3 weeks ago and as per son-in-law, who is present in the room, at that time, he was fine. For the last one week, he started having some blackening of his toes on the left side and had some mild pain, also. The patient has complete dementia, so history obtained from his son-in-law and daughter, who is present in the room. As per them, they  today as a regular visit to Dr. Ether Griffins and on looking at his toes, which are completely gangrenous on the left side, he sent the patient in for direct admission and suggested to get a vascular evaluation for possible amputation. ER physician called for admission to hospitalist team for further management.   REVIEW OF SYSTEMS: Unable to get it, as the patient has dementia.     PAST MEDICAL HISTORY: 1. Hypertension.  2. Coronary artery disease, bypass surgery, and pacemaker placement.  3. Second-degree heart block and Wenckebach-type, pacemaker placement.  4. Hyperlipidemia.  5. Renal insufficiency with baseline creatinine around  2.6.  6. Non-insulin-dependent diabetes, diet control.  7. Peripheral vascular disease and stent placement in the past.  8. Mild stroke in the past.   PAST SURGICAL HISTORY: Coronary artery bypass surgery.   MEDICATIONS:  1. Lisinopril 5 mg once a day.  2. Furosemide 40 mg once a day.  3. Clonazepam 1 mg once a day.  4. Carvedilol 12.5 mg 2 times a day.  5. Acetaminophen and tramadol 325/37.5 mg 4-6 hours as needed for pain.   PHYSICAL EXAMINATION: VITAL SIGNS: A temperature 98.7, pulse 75, respirations 18, blood pressure 91/53, pulse oxygen 96 on room air.  GENERAL: The patient is alert, but disoriented. He is calm, but has dementia.  HEENT: Head and neck atraumatic. Conjunctivae pink. Sclerae anicteric. Pupils reactive to light. Oral mucosa moist.  NECK: Supple. No JVD.  RESPIRATORY: Bilateral equal and clear air entry. No wheezing or crepitation.  CARDIOVASCULAR: S1, S2 present, regular. Pacemaker in place.  ABDOMEN: Soft, nontender, bowel sounds present. No organomegaly, no distention.  SKIN: No acne, rashes, or lesions.  LEGS: There is no edema, but on the left foot, there is darkening of the color, which extends all the way from his ankle up to the toe, and his other toes are turning black, which mostly represents dry gangrene. There is no palpable pulse on dorsalis pedis on the left side. On the right, very weak pulse on dorsalis pedis. There is some cheesy, liquidy material between his toes on the left side.  NEUROLOGIC: Power is 4/5. He is able to move all 4 limbs, follows commands.  Sensations appear to be intact in upper limbs. In lower limbs, it appears to be decreased. No tremor or rigidity.   PSYCHIATRY: Unable to check, as the patient has dementia.   IMPORTANT LABORATORY RESULTS: Glucose 126, BUN 61, creatinine 1.71, sodium 142, potassium 5.2, chloride 116, CO2 is 19, calcium is 8.7, lactic acid 1.2. Total protein 6.5, albumin 3.2, bilirubin 0.5, alkaline phosphate 71, SGOT  20, and SGPT is 15. WBC 9.5, hemoglobin 11.2, platelet count 309,000. MCV is 94.   ASSESSMENT AND PLAN: An 81 year old male who has past history of coronary artery disease, hypertension, hyperlipidemia, renal insufficiency, and peripheral vascular disease and stent, for the last one week has his left toes turning black, and so came to Emergency Room, sent from Dr. Irene LimboFowler's office.  1. Gangrene of the toes. We will admit him to medical floor and get podiatry consult for further management. There is some questionable infection also present on his toes, so we will start him on vancomycin and meropenem. Further decision about continuing antibiotics and possibility of surgery depends on vascular consult  2. History of coronary artery disease and bypass surgery. The patient was not taking any aspirin or Plavix. He is just on lisinopril and metoprolol. We will just continue lisinopril, hold metoprolol because of lower running blood pressure, and call cardiology consult, as the patient potentially needs vascular surgery to get clearance for that.  3. History of diet-controlled diabetes. We will put him on insulin sliding scale coverage  4. Renal insufficiency. Creatinine appears to be at baseline, around 2.5, 2.6. We will just continue monitoring.   TOTAL TIME SPENT ON THIS ADMISSION: 50 minutes.     ____________________________ Hope PigeonVaibhavkumar G. Elisabeth PigeonVachhani, MD [id number] D: 12/12/2014 20:28:01 ET T: 12/12/2014 21:08:44 ET JOB#: [id number]  cc: Hope PigeonVaibhavkumar G. Elisabeth PigeonVachhani, MD, <Dictator> Teena Iraniavid M. Terance HartBronstein, MD Argentina DonovanJustin A. Ether GriffinsFowler, DPM Marcina MillardAlexander Paraschos, MD Altamese DillingVAIBHAVKUMAR Charley Miske MD ELECTRONICALLY SIGNED 12/26/2014 0:41

## 2016-07-07 ENCOUNTER — Other Ambulatory Visit (INDEPENDENT_AMBULATORY_CARE_PROVIDER_SITE_OTHER): Payer: Self-pay | Admitting: Vascular Surgery

## 2016-07-07 DIAGNOSIS — I739 Peripheral vascular disease, unspecified: Secondary | ICD-10-CM

## 2016-07-07 DIAGNOSIS — I70219 Atherosclerosis of native arteries of extremities with intermittent claudication, unspecified extremity: Secondary | ICD-10-CM

## 2016-07-08 ENCOUNTER — Ambulatory Visit (INDEPENDENT_AMBULATORY_CARE_PROVIDER_SITE_OTHER): Payer: Medicare Other | Admitting: Vascular Surgery

## 2016-07-08 ENCOUNTER — Other Ambulatory Visit (INDEPENDENT_AMBULATORY_CARE_PROVIDER_SITE_OTHER): Payer: Self-pay | Admitting: Vascular Surgery

## 2016-07-08 ENCOUNTER — Encounter (INDEPENDENT_AMBULATORY_CARE_PROVIDER_SITE_OTHER): Payer: Self-pay | Admitting: Vascular Surgery

## 2016-07-08 ENCOUNTER — Ambulatory Visit (INDEPENDENT_AMBULATORY_CARE_PROVIDER_SITE_OTHER): Payer: Medicare Other

## 2016-07-08 DIAGNOSIS — I739 Peripheral vascular disease, unspecified: Secondary | ICD-10-CM | POA: Diagnosis not present

## 2016-07-08 DIAGNOSIS — I10 Essential (primary) hypertension: Secondary | ICD-10-CM | POA: Diagnosis not present

## 2016-07-08 DIAGNOSIS — I70219 Atherosclerosis of native arteries of extremities with intermittent claudication, unspecified extremity: Secondary | ICD-10-CM

## 2016-07-08 DIAGNOSIS — Z89612 Acquired absence of left leg above knee: Secondary | ICD-10-CM | POA: Diagnosis not present

## 2016-07-08 DIAGNOSIS — I70211 Atherosclerosis of native arteries of extremities with intermittent claudication, right leg: Secondary | ICD-10-CM | POA: Diagnosis not present

## 2016-07-08 DIAGNOSIS — E785 Hyperlipidemia, unspecified: Secondary | ICD-10-CM | POA: Insufficient documentation

## 2016-07-08 DIAGNOSIS — E782 Mixed hyperlipidemia: Secondary | ICD-10-CM | POA: Diagnosis not present

## 2016-07-08 DIAGNOSIS — I7025 Atherosclerosis of native arteries of other extremities with ulceration: Secondary | ICD-10-CM | POA: Insufficient documentation

## 2016-07-08 DIAGNOSIS — I2510 Atherosclerotic heart disease of native coronary artery without angina pectoris: Secondary | ICD-10-CM | POA: Diagnosis not present

## 2016-07-08 DIAGNOSIS — Z89619 Acquired absence of unspecified leg above knee: Secondary | ICD-10-CM | POA: Insufficient documentation

## 2016-07-08 NOTE — Progress Notes (Signed)
MRN : [id number]  Jack French is a 82 y.o. (June 05, 1924) male who presents with chief complaint of  Chief Complaint  Patient presents with  . Re-evaluation    Ultrasound follow up  .  History of Present Illness:  The patient returns to the office for followup and review of the noninvasive studies. There have been no interval changes in lower extremity symptoms. No interval shortening of the patient's claudication distance.  No development of rest pain symptoms. No new ulcers or wounds have occurred since the last visit.  There have been no significant changes to the patient's overall health care.  The patient denies amaurosis fugax or recent TIA symptoms. There are no recent neurological changes noted. The patient denies history of DVT, PE or superficial thrombophlebitis. The patient denies recent episodes of angina or shortness of breath.    ABI's Rt=0.46 and Lt=AKA  Duplex ultrasound of the shows the right leg arterial system is patent no focal hemodynamically significant lesion identified Current Outpatient Prescriptions  Medication Sig Dispense Refill  . carvedilol (COREG) 12.5 MG tablet   10  . divalproex (DEPAKOTE ER) 250 MG 24 hr tablet Take by mouth.    . senna-docusate (SENOKOT-S) 8.6-50 MG tablet Take by mouth.     No current facility-administered medications for this visit.     Past Medical History:  Diagnosis Date  . Chronic kidney disease   . Dementia   . Diabetes mellitus without complication (HCC)   . Hypertension   . Peripheral vascular disease [facility](HCC)     Past Surgical History:  Procedure Laterality Date  . ABOVE KNEE LEG AMPUTATION Left   . TRICEPS TENDON REPAIR      Social History Social History  Substance Use Topics  . Smoking status: Former Games developermoker  . Smokeless tobacco: Never Used  . Alcohol use No    Allergies  Allergen Reactions  . Penicillin G Rash     REVIEW OF SYSTEMS (Negative unless checked)  Constitutional: [] Weight loss   [] Fever  [] Chills Cardiac: [] Chest pain   [] Chest pressure   [] Palpitations   [] Shortness of breath at rest   [] Shortness of breath with exertion. Vascular:  [] Pain in legs with walking   [] Pain in legs at rest    [] Pain in feet at rest    [] History of DVT   [] Phlebitis   [] Swelling in legs   [] Varicose veins   [] Non-healing ulcers Pulmonary:   [] Uses home oxygen   [] Productive cough   [] Hemoptysis   [] Wheeze  [] COPD   [] Asthma Neurologic:  [] Dizziness  [] Blackouts   [] Seizures   [] History of stroke   [] History of TIA  [] Aphasia   [] Temporary blindness   [] Dysphagia   [] Weakness or numbness in arm   [] Weakness or numbness in leg Musculoskeletal:  [x] Arthritis   [] Joint swelling   [] Joint pain   [] Low back pain Hematologic:  [] Easy bruising  [] Easy bleeding   [] Hypercoagulable state   [] Anemic   Gastrointestinal:  [] Blood in stool   [] Vomiting blood  [] Gastroesophageal reflux/heartburn   [] Difficulty swallowing. Genitourinary:  [] Chronic kidney disease   [] Difficult urination  [] Frequent urination  [] Burning with urination   [] Blood in urine Skin:  [] Rashes   [] Ulcers   Psychological:  [] History of anxiety   []  History of major depression.    Physical Examination  Vitals:   07/08/16 1117  BP: 104/62  Pulse: 65  Resp: 16  Weight: 150 lb (68 kg)  Height: 6\' 1"  (1.854 m)  Body mass index is 19.79 kg/m. Gen:  WD/WN, NAD Head: [place]/AT, No temporalis wasting. Ear/Nose/Throat: Hearing grossly intact, nares w/o erythema or drainage, trachea midline Eyes: PERR, EOM appear normal. Sclera non-icteric Neck: Supple, no nuchal rigidity.  No bruit or JVD.  Pulmonary:  Good air movement, equal and clear to auscultation bilaterally.  Cardiac: RRR, normal S1, S2, no Murmurs, rubs or gallops. Vascular:    Vessel Right Left  Radial Palpable Palpable  Ulnar Palpable Palpable  Brachial Palpable Palpable  Carotid Palpable Palpable  Aorta Not palpable N/A  Femoral Palpable Palpable  Popliteal Not  Palpable AKA  PT Not Palpable AKA  DP Not Palpable AKA   Gastrointestinal: soft, non-rigid/non-distended. No guarding.  Musculoskeletal: M/S 5/5 throughout.  No deformity or atrophy. Neurologic: CN 2-12 intact. Pain and light touch intact in extremities.  Speech is fluent. Motor exam as listed above. Psychiatric: Judgment intact, Mood & affect appropriate for pt's clinical situation. Dermatologic: No rashes or ulcers noted.  No signs consistent with cellulitis. Lymphatic:  No cervical lymphadenopathy  CBC Lab Results  Component Value Date   WBC 8.6 12/24/2014   HGB 9.4 (L) 12/24/2014   HCT 32.6 (L) 12/22/2014   MCV 94 12/22/2014   PLT 232 12/22/2014    BMET    Component Value Date/Time   NA 139 12/25/2014 0817   K 4.7 12/25/2014 0817   CL 112 (H) 12/25/2014 0817   CO2 22 12/25/2014 0817   GLUCOSE 108 (H) 12/25/2014 0817   BUN 48 (H) 12/25/2014 0817   CREATININE 1.80 (H) 12/25/2014 0817   CALCIUM 7.5 (L) 12/25/2014 0817   GFRNONAA 33 (L) 12/25/2014 0817   GFRAA 38 (L) 12/25/2014 0817   CrCl cannot be calculated (Patient's most recent lab result is older than the maximum 21 days allowed.).  COAG Lab Results  Component Value Date   INR 1.1 10/25/2013    Radiology No results found.    Assessment/Plan 1. Atherosclerosis of native artery of right lower extremity with intermittent claudication (HCC)  Recommend:  The patient has evidence of atherosclerosis of the lower extremities with claudication.  The patient does not voice lifestyle limiting changes at this point in time.  Noninvasive studies do not suggest clinically significant change.  No invasive studies, angiography or surgery at this time The patient should continue walking and begin a more formal exercise program.  The patient should continue antiplatelet therapy and aggressive treatment of the lipid abnormalities  No changes in the patient's medications at this time  The patient should continue  wearing graduated compression socks 10-15 mmHg strength to control the mild edema.   - VAS [place] ABI WITH/WO TBI; Future  2. Status post above knee amputation of left lower extremity (HCC) He is not interested in a prosthesis at this time  3. Coronary artery disease involving native heart, angina presence unspecified, unspecified vessel or lesion type Continue meds as listed NTG prn chest pain  4. Essential hypertension Continue antihypertensives  5. Mixed hyperlipidemia Continue statin    Levora Dredge, MD  07/08/2016 12:50 PM    This note was created with Dragon medical transcription system.  Any errors from dictation are purely unintentional

## 2017-01-10 ENCOUNTER — Encounter (INDEPENDENT_AMBULATORY_CARE_PROVIDER_SITE_OTHER): Payer: Medicare Other

## 2017-01-10 ENCOUNTER — Ambulatory Visit (INDEPENDENT_AMBULATORY_CARE_PROVIDER_SITE_OTHER): Payer: Medicare Other | Admitting: Vascular Surgery

## 2017-03-17 ENCOUNTER — Ambulatory Visit (INDEPENDENT_AMBULATORY_CARE_PROVIDER_SITE_OTHER): Payer: Medicare Other | Admitting: Vascular Surgery

## 2017-03-17 ENCOUNTER — Encounter (INDEPENDENT_AMBULATORY_CARE_PROVIDER_SITE_OTHER): Payer: Medicare Other

## 2017-04-07 ENCOUNTER — Ambulatory Visit (INDEPENDENT_AMBULATORY_CARE_PROVIDER_SITE_OTHER): Payer: Medicare Other | Admitting: Vascular Surgery

## 2017-04-07 ENCOUNTER — Ambulatory Visit (INDEPENDENT_AMBULATORY_CARE_PROVIDER_SITE_OTHER): Payer: Medicare Other

## 2017-04-07 ENCOUNTER — Encounter (INDEPENDENT_AMBULATORY_CARE_PROVIDER_SITE_OTHER): Payer: Self-pay | Admitting: Vascular Surgery

## 2017-04-07 VITALS — BP 118/73 | HR 80 | Resp 16

## 2017-04-07 DIAGNOSIS — Z89612 Acquired absence of left leg above knee: Secondary | ICD-10-CM | POA: Diagnosis not present

## 2017-04-07 DIAGNOSIS — E782 Mixed hyperlipidemia: Secondary | ICD-10-CM

## 2017-04-07 DIAGNOSIS — I10 Essential (primary) hypertension: Secondary | ICD-10-CM | POA: Diagnosis not present

## 2017-04-07 DIAGNOSIS — I70211 Atherosclerosis of native arteries of extremities with intermittent claudication, right leg: Secondary | ICD-10-CM

## 2017-04-07 DIAGNOSIS — I2510 Atherosclerotic heart disease of native coronary artery without angina pectoris: Secondary | ICD-10-CM | POA: Diagnosis not present

## 2017-04-10 NOTE — Progress Notes (Signed)
MRN : [id number]  Jack French is a 83 y.o. (1924-05-03) male who presents with chief complaint of  Chief Complaint  Patient presents with  . ultrasound follow up  .  History of Present Illness: The patient returns to the office for followup and review of the noninvasive studies. There has been a significant deterioration in the lower extremity symptoms.  The patient notes interval shortening of their claudication distance and development of mild rest pain symptoms. No new ulcers or wounds have occurred since the last visit.  There have been no significant changes to the patient's overall health care.  The patient denies amaurosis fugax or recent TIA symptoms. There are no recent neurological changes noted. The patient denies history of DVT, PE or superficial thrombophlebitis. The patient denies recent episodes of angina or shortness of breath.    Current Meds  Medication Sig  . bisacodyl (BISACODYL) 5 MG EC tablet Take by mouth.  . carvedilol (COREG) 12.5 MG tablet   . divalproex (DEPAKOTE ER) 250 MG 24 hr tablet Take by mouth.  . senna-docusate (SENOKOT-S) 8.6-50 MG tablet Take by mouth.    Past Medical History:  Diagnosis Date  . Chronic kidney disease   . Dementia   . Diabetes mellitus without complication (HCC)   . Hypertension   . Peripheral vascular disease [facility])     Past Surgical History:  Procedure Laterality Date  . ABOVE KNEE LEG AMPUTATION Left   . TRICEPS TENDON REPAIR      Social History Social History  Substance Use Topics  . Smoking status: Former Games developer  . Smokeless tobacco: Never Used  . Alcohol use No    Family History No family history on file.  Allergies  Allergen Reactions  . Penicillin G Rash     REVIEW OF SYSTEMS (Negative unless checked)  Constitutional: [] Weight loss  [] Fever  [] Chills Cardiac: [] Chest pain   [] Chest pressure   [] Palpitations   [] Shortness of breath when laying flat   [] Shortness of breath with  exertion. Vascular:  [] Pain in legs with walking   [] Pain in legs at rest  [] History of DVT   [] Phlebitis   [] Swelling in legs   [] Varicose veins   [] Non-healing ulcers Pulmonary:   [] Uses home oxygen   [] Productive cough   [] Hemoptysis   [] Wheeze  [] COPD   [] Asthma Neurologic:  [] Dizziness   [] Seizures   [] History of stroke   [] History of TIA  [] Aphasia   [] Vissual changes   [] Weakness or numbness in arm   [] Weakness or numbness in leg Musculoskeletal:   [] Joint swelling   [] Joint pain   [] Low back pain Hematologic:  [] Easy bruising  [] Easy bleeding   [] Hypercoagulable state   [] Anemic Gastrointestinal:  [] Diarrhea   [] Vomiting  [] Gastroesophageal reflux/heartburn   [] Difficulty swallowing. Genitourinary:  [] Chronic kidney disease   [] Difficult urination  [] Frequent urination   [] Blood in urine Skin:  [] Rashes   [] Ulcers  Psychological:  [] History of anxiety   []  History of major depression.  Physical Examination  Vitals:   04/07/17 1043  BP: 118/73  Pulse: 80  Resp: 16   There is no height or weight on file to calculate BMI. Gen: WD/WN, NAD Head: [place]/AT, No temporalis wasting.  Ear/Nose/Throat: Hearing grossly intact, nares w/o erythema or drainage Eyes: PER, EOMI, sclera nonicteric.  Neck: Supple, no large masses.   Pulmonary:  Good air movement, no audible wheezing bilaterally, no use of accessory muscles.  Cardiac: RRR, no JVD Vascular:  Vessel Right Left  Radial Palpable Palpable  Ulnar Palpable Palpable  Brachial Palpable Palpable  Carotid Palpable Palpable  Femoral Palpable Palpable  Popliteal Not Palpable AKA  PT Not Palpable AKA  DP Not Palpable AKA  Gastrointestinal: Non-distended. No guarding/no peritoneal signs.  Musculoskeletal: M/S 5/5 throughout.  No deformity or atrophy.  Neurologic: CN 2-12 intact. Symmetrical.  Speech is fluent. Motor exam as listed above. Psychiatric: Judgment intact, Mood & affect appropriate for pt's clinical situation. Dermatologic: No  rashes or ulcers noted.  No changes consistent with cellulitis. Lymph : No lichenification or skin changes of chronic lymphedema.  CBC Lab Results  Component Value Date   WBC 8.6 12/24/2014   HGB 9.4 (L) 12/24/2014   HCT 32.6 (L) 12/22/2014   MCV 94 12/22/2014   PLT 232 12/22/2014    BMET    Component Value Date/Time   NA 139 12/25/2014 0817   K 4.7 12/25/2014 0817   CL 112 (H) 12/25/2014 0817   CO2 22 12/25/2014 0817   GLUCOSE 108 (H) 12/25/2014 0817   BUN 48 (H) 12/25/2014 0817   CREATININE 1.80 (H) 12/25/2014 0817   CALCIUM 7.5 (L) 12/25/2014 0817   GFRNONAA 33 (L) 12/25/2014 0817   GFRAA 38 (L) 12/25/2014 0817   CrCl cannot be calculated (Patient's most recent lab result is older than the maximum 21 days allowed.).  COAG Lab Results  Component Value Date   INR 1.1 10/25/2013    Radiology No results found.   Assessment/Plan 1. Atherosclerosis of native artery of right lower extremity with intermittent claudication (HCC)  Recommend:  The patient has evidence of atherosclerosis of the lower extremities with claudication.  The patient does not voice lifestyle limiting changes at this point in time.  He adamantly denies pain.  Noninvasive studies do suggest clinically significant change.  Still he voices no new complaints  No invasive studies, angiography or surgery at this time The patient should continue walking and begin a more formal exercise program.  The patient should continue antiplatelet therapy and aggressive treatment of the lipid abnormalities  No changes in the patient's medications at this time  The patient should continue wearing graduated compression socks 10-15 mmHg strength to control the mild edema.    2. Coronary artery disease involving native heart, angina presence unspecified, unspecified vessel or lesion type Continue cardiac and antihypertensive medications as already ordered and reviewed, no changes at this time.  Continue statin as  ordered and reviewed, no changes at this time  Nitrates PRN for chest pain   3. Essential hypertension Continue antihypertensive medications as already ordered, these medications have been reviewed and there are no changes at this time.   4. Status post above knee amputation of left lower extremity (HCC) Stable no changes  5. Mixed hyperlipidemia Continue statin as ordered and reviewed, no changes at this time     Levora DredgeGregory Bettyjo Lundblad, MD  04/10/2017 3:07 PM

## 2017-05-08 ENCOUNTER — Emergency Department: Payer: Medicare Other

## 2017-05-08 ENCOUNTER — Emergency Department
Admission: EM | Admit: 2017-05-08 | Discharge: 2017-05-09 | Disposition: A | Discharge status: Skilled Nursing Facility | Payer: Medicare Other | Attending: Emergency Medicine | Admitting: Emergency Medicine

## 2017-05-08 ENCOUNTER — Encounter: Payer: Self-pay | Admitting: Emergency Medicine

## 2017-05-08 DIAGNOSIS — S0990XA Unspecified injury of head, initial encounter: Secondary | ICD-10-CM | POA: Diagnosis not present

## 2017-05-08 DIAGNOSIS — S0993XA Unspecified injury of face, initial encounter: Secondary | ICD-10-CM | POA: Diagnosis present

## 2017-05-08 DIAGNOSIS — I129 Hypertensive chronic kidney disease with stage 1 through stage 4 chronic kidney disease, or unspecified chronic kidney disease: Secondary | ICD-10-CM | POA: Diagnosis not present

## 2017-05-08 DIAGNOSIS — Z79899 Other long term (current) drug therapy: Secondary | ICD-10-CM | POA: Insufficient documentation

## 2017-05-08 DIAGNOSIS — Z87891 Personal history of nicotine dependence: Secondary | ICD-10-CM | POA: Insufficient documentation

## 2017-05-08 DIAGNOSIS — Y999 Unspecified external cause status: Secondary | ICD-10-CM | POA: Diagnosis not present

## 2017-05-08 DIAGNOSIS — S0181XA Laceration without foreign body of other part of head, initial encounter: Secondary | ICD-10-CM

## 2017-05-08 DIAGNOSIS — Y939 Activity, unspecified: Secondary | ICD-10-CM | POA: Diagnosis not present

## 2017-05-08 DIAGNOSIS — W19XXXA Unspecified fall, initial encounter: Secondary | ICD-10-CM | POA: Diagnosis not present

## 2017-05-08 DIAGNOSIS — E1122 Type 2 diabetes mellitus with diabetic chronic kidney disease: Secondary | ICD-10-CM | POA: Insufficient documentation

## 2017-05-08 DIAGNOSIS — N189 Chronic kidney disease, unspecified: Secondary | ICD-10-CM | POA: Insufficient documentation

## 2017-05-08 DIAGNOSIS — Y929 Unspecified place or not applicable: Secondary | ICD-10-CM | POA: Insufficient documentation

## 2017-05-08 MED ORDER — LIDOCAINE-EPINEPHRINE-TETRACAINE (LET) SOLUTION
3.0000 mL | Freq: Once | NASAL | Status: AC
  Administered 2017-05-09: 3 mL via TOPICAL
  Filled 2017-05-08 (×2): qty 3

## 2017-05-08 NOTE — ED Triage Notes (Signed)
Pt arrives via [facility] with pt coming from [facility]. Pt arrives with c/o fall and has head laceration to the front of his head. Pt reports only pain in head and has no hx of blood thinners and denies LOC at this time.

## 2017-05-09 LAB — BASIC METABOLIC PANEL
ANION GAP: 5 (ref 5–15)
BUN: 37 mg/dL — ABNORMAL HIGH (ref 6–20)
CHLORIDE: 104 mmol/L (ref 101–111)
CO2: 25 mmol/L (ref 22–32)
CREATININE: 1.93 mg/dL — AB (ref 0.61–1.24)
Calcium: 8.3 mg/dL — ABNORMAL LOW (ref 8.9–10.3)
GFR calc non Af Amer: 29 mL/min — ABNORMAL LOW (ref >60)
GFR, EST AFRICAN AMERICAN: 34 mL/min — AB (ref >60)
Glucose, Bld: 93 mg/dL (ref 65–99)
Potassium: 4.7 mmol/L (ref 3.5–5.1)
Sodium: 134 mmol/L — ABNORMAL LOW (ref 135–145)

## 2017-05-09 LAB — TROPONIN I
TROPONIN I: 0.06 ng/mL — AB (ref <0.03)
Troponin I: 0.05 ng/mL (ref <0.03)

## 2017-05-09 LAB — CBC
HEMATOCRIT: 36 % — AB (ref 40.0–52.0)
HEMOGLOBIN: 12.2 g/dL — AB (ref 13.0–18.0)
MCH: 31.2 pg (ref 26.0–34.0)
MCHC: 34 g/dL (ref 32.0–36.0)
MCV: 91.6 fL (ref 80.0–100.0)
Platelets: 150 10*3/uL (ref 150–440)
RBC: 3.93 MIL/uL — ABNORMAL LOW (ref 4.40–5.90)
RDW: 14.3 % (ref 11.5–14.5)
WBC: 4.9 10*3/uL (ref 3.8–10.6)

## 2017-05-09 MED ORDER — LIDOCAINE HCL (PF) 1 % IJ SOLN
5.0000 mL | Freq: Once | INTRAMUSCULAR | Status: AC
  Administered 2017-05-09: 5 mL via INTRADERMAL
  Filled 2017-05-09: qty 5

## 2017-05-09 MED ORDER — BACITRACIN ZINC 500 UNIT/GM EX OINT
TOPICAL_OINTMENT | CUTANEOUS | Status: AC
  Filled 2017-05-09: qty 0.9

## 2017-05-09 MED ORDER — BACITRACIN ZINC 500 UNIT/GM EX OINT
TOPICAL_OINTMENT | Freq: Two times a day (BID) | CUTANEOUS | Status: DC
  Administered 2017-05-09: 07:00:00 via TOPICAL
  Filled 2017-05-09: qty 0.9

## 2017-05-09 MED ORDER — BACITRACIN ZINC 500 UNIT/GM EX OINT: TOPICAL_OINTMENT | CUTANEOUS | 0 refills | Status: DC

## 2017-05-09 NOTE — ED Notes (Signed)
MD Zenda Alpers made aware of elevated troponin.

## 2017-05-09 NOTE — ED Notes (Signed)
Pt attempted to be in and out catheterized by this RN and Lorin Picket EDT. Pt had no urine in bladder at this time.

## 2017-05-09 NOTE — ED Notes (Signed)
Pt found in large BM. Pt changed by this RN along with sheets and diaper.

## 2017-05-09 NOTE — ED Provider Notes (Signed)
[facility] Emergency Department Provider Note   ____________________________________________   First MD Initiated Contact with Patient 05/08/17 2330     (approximate)  I have reviewed the triage vital signs and the nursing notes.   HISTORY  Chief Complaint Head Laceration  Patient with history of dementia who has a difficult time recalling history.  HPI Jack French is a 83 y.o. male who comes into the hospital today with a fall. The patient reports that he was getting ready to go somewhere and he had an accident. The patient states he fell on his face and hit his face on the ground. He reports that he didn't pass out but he was crying. The patient reports that it was terrible hitting the floor with his face. He denies any chest pain or headache, abdominal pain, shortness of breath. He reports that he is okay. He does not know how he came to fall but just that he fell. The patient is here for evaluation.   Past Medical History:  Diagnosis Date  . Chronic kidney disease   . Dementia   . Diabetes mellitus without complication (HCC)   . Hypertension   . Peripheral vascular disease [facility])     Patient Active Problem List   Diagnosis Date Noted  . Atherosclerosis of native arteries of extremity with intermittent claudication (HCC) 07/08/2016  . Status post above knee amputation (HCC) 07/08/2016  . Coronary artery disease 07/08/2016  . Essential hypertension 07/08/2016  . Hyperlipidemia 07/08/2016    Past Surgical History:  Procedure Laterality Date  . ABOVE KNEE LEG AMPUTATION Left   . TRICEPS TENDON REPAIR      Prior to Admission medications   Medication Sig Start Date End Date Taking? Authorizing Provider  bisacodyl (BISACODYL) 5 MG EC tablet Take 5 mg by mouth daily as needed.    Yes [provider]  carvedilol (COREG) 12.5 MG tablet Take 12.5 mg by mouth 2 (two) times daily with a meal.  06/06/16  Yes [provider]    divalproex (DEPAKOTE ER) 250 MG 24 hr tablet Take 250 mg by mouth daily.    Yes [provider]  divalproex (DEPAKOTE SPRINKLE) 125 MG capsule Take 125 mg by mouth 2 (two) times daily.  04/27/17  Yes [provider]  senna-docusate (SENOKOT-S) 8.6-50 MG tablet Take 1 tablet by mouth at bedtime as needed.    Yes [provider]  bacitracin ointment Apply to affected area daily 05/09/17 05/09/18  Rebecka Apley, MD    Allergies Penicillin g  No family history on file.  Social History Social History  Substance Use Topics  . Smoking status: Former Games developer  . Smokeless tobacco: Never Used  . Alcohol use No    Review of Systems  Constitutional: No fever/chills Eyes: No visual changes. ENT: No sore throat. Cardiovascular: Denies chest pain. Respiratory: Denies shortness of breath. Gastrointestinal: No abdominal pain.  No nausea, no vomiting.   Genitourinary: Negative for dysuria. Musculoskeletal: Negative for back pain. Skin: laceration to forehead Neurological: Negative for headaches,   ____________________________________________   PHYSICAL EXAM:  VITAL SIGNS: ED Triage Vitals  Enc Vitals Group     BP 05/08/17 2152 124/79     Pulse Rate 05/08/17 2152 82     Resp 05/08/17 2152 16     Temp 05/08/17 2152 98.6 F (37 C)     Temp Source 05/08/17 2152 Oral     SpO2 05/08/17 2152 96 %  Weight 05/08/17 2210 165 lb (74.8 kg)     Height 05/08/17 2210 6' (1.829 m)     Head Circumference --      Peak Flow --      Pain Score 05/08/17 2158 8     Pain Loc --      Pain Edu? --      Excl. in GC? --     Constitutional: Alert and disoriented. Well appearing and in Mild distress. Eyes: Conjunctivae are normal. PERRL. EOMI. Head: laceration to forehead Nose: No congestion/rhinnorhea. Mouth/Throat: Mucous membranes are moist.  Oropharynx non-erythematous. Neck: No cervical spine tenderness to palpation. Cardiovascular: Normal rate, regular rhythm.  Grossly normal heart sounds.  Good peripheral circulation. Respiratory: Normal respiratory effort.  No retractions. Lungs CTAB. Gastrointestinal: Soft and nontender. No distention. Positive bowel sounds Musculoskeletal: No lower extremity tenderness nor edema.  Neurologic:  Normal speech and language.  Skin:  Skin is warm, dry, 5cm laceration to middle of forehead.  Psychiatric: Mood and affect are normal.   ____________________________________________   LABS (all labs ordered are listed, but only abnormal results are displayed)  Labs Reviewed  CBC - Abnormal; Notable for the following:       Result Value   RBC 3.93 (*)    Hemoglobin 12.2 (*)    HCT 36.0 (*)    All other components within normal limits  BASIC METABOLIC PANEL - Abnormal; Notable for the following:    Sodium 134 (*)    BUN 37 (*)    Creatinine, Ser 1.93 (*)    Calcium 8.3 (*)    GFR calc non Af Amer 29 (*)    GFR calc Af Amer 34 (*)    All other components within normal limits  TROPONIN I - Abnormal; Notable for the following:    Troponin I 0.05 (*)    All other components within normal limits  TROPONIN I - Abnormal; Notable for the following:    Troponin I 0.06 (*)    All other components within normal limits  URINALYSIS, COMPLETE (UACMP) WITH MICROSCOPIC   ____________________________________________  EKG  ED ECG REPORT I, Rebecka Apley, the attending physician, personally viewed and interpreted this ECG.   Date: 05/08/2017  EKG Time: 2115  Rate: 63  Rhythm: normal sinus rhythm, AV dual paced rhythm  Axis: left axis deviation  Intervals:AV dual paced rhythm  ST&T Change: AV paced rhythm, ST depression V5, V6, aVL  ____________________________________________  RADIOLOGY  Ct Head Wo Contrast  Result Date: 05/08/2017 CLINICAL DATA:  Status post fall with frontal laceration. EXAM: CT HEAD WITHOUT CONTRAST CT CERVICAL SPINE WITHOUT CONTRAST TECHNIQUE: Multidetector CT imaging of the head and  cervical spine was performed following the standard protocol without intravenous contrast. Multiplanar CT image reconstructions of the cervical spine were also generated. COMPARISON:  10/21/2007 FINDINGS: CT HEAD FINDINGS Brain: No evidence of acute infarction, hemorrhage, hydrocephalus, extra-axial collection or mass lesion/mass effect. Advanced brain parenchymal volume loss and periventricular microangiopathy. Vascular: Calcific atherosclerotic disease at the skullbase. Skull: Normal. Negative for fracture or focal lesion. Sinuses/Orbits: No acute finding. Other: None. CT CERVICAL SPINE FINDINGS Alignment: 6 mm anterolisthesis of C7 on T1. 3 mm anterolisthesis of C4 on C5. Minimal posterior listhesis of C3 on C4. Skull base and vertebrae: No acute fracture. No primary bone lesion or focal pathologic process. Soft tissues and spinal canal: No prevertebral fluid or swelling. No visible canal hematoma. Disc levels: Advanced osteoarthritic changes with disc space narrowing, endplate sclerosis, vertebral body  remodeling and posterior facet arthropathy at all levels of the cervical spine. Upper chest: Calcific atherosclerotic disease of the aorta. Other: None. IMPRESSION: No acute intracranial abnormality. Advanced brain parenchymal atrophy and chronic microvascular disease. No evidence of acute traumatic injury to the cervical spine. 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 Most severe anterolisthesis at C7-T1. Multilevel focal narrowing of the spinal canal as a result of the osteoarthritic changes and alignment abnormalities places the spinal cord at risk of injury without presence of a vertebral fracture. Please correlate clinically. Electronically Signed   By: Ted Mcalpine M.D.   On: 05/08/2017 23:58   Ct Cervical Spine Wo Contrast  Result Date: 05/08/2017 CLINICAL DATA:  Status post fall with frontal laceration. EXAM: CT HEAD WITHOUT CONTRAST CT  CERVICAL SPINE WITHOUT CONTRAST TECHNIQUE: Multidetector CT imaging of the head and cervical spine was performed following the standard protocol without intravenous contrast. Multiplanar CT image reconstructions of the cervical spine were also generated. COMPARISON:  10/21/2007 FINDINGS: CT HEAD FINDINGS Brain: No evidence of acute infarction, hemorrhage, hydrocephalus, extra-axial collection or mass lesion/mass effect. Advanced brain parenchymal volume loss and periventricular microangiopathy. Vascular: Calcific atherosclerotic disease at the skullbase. Skull: Normal. Negative for fracture or focal lesion. Sinuses/Orbits: No acute finding. Other: None. CT CERVICAL SPINE FINDINGS Alignment: 6 mm anterolisthesis of C7 on T1. 3 mm anterolisthesis of C4 on C5. Minimal posterior listhesis of C3 on C4. Skull base and vertebrae: No acute fracture. No primary bone lesion or focal pathologic process. Soft tissues and spinal canal: No prevertebral fluid or swelling. No visible canal hematoma. Disc levels: Advanced osteoarthritic changes with disc space narrowing, endplate sclerosis, vertebral body remodeling and posterior facet arthropathy at all levels of the cervical spine. Upper chest: Calcific atherosclerotic disease of the aorta. Other: None. IMPRESSION: No acute intracranial abnormality. Advanced brain parenchymal atrophy and chronic microvascular disease. No evidence of acute traumatic injury to the cervical spine. 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 Most severe anterolisthesis at C7-T1. Multilevel focal narrowing of the spinal canal as a result of the osteoarthritic changes and alignment abnormalities places the spinal cord at risk of injury without presence of a vertebral fracture. Please correlate clinically. Electronically Signed   By: Ted Mcalpine M.D.   On: 05/08/2017 23:58     ____________________________________________   PROCEDURES  Procedure(s) performed: please, see procedure note(s).  Marland Kitchen.Laceration Repair Date/Time: 05/09/2017 1:45 AM Performed by: Rebecka Apley Authorized by: Rebecka Apley   Anesthesia (see MAR for exact dosages):    Anesthesia method:  Topical application and local infiltration   Topical anesthetic:  LET   Local anesthetic:  Lidocaine 1% w/o epi Laceration details:    Location:  Face   Face location:  Forehead   Length (cm):  5 Repair type:    Repair type:  Simple Pre-procedure details:    Preparation:  Patient was prepped and draped in usual sterile fashion Exploration:    Contaminated: no   Treatment:    Area cleansed with:  Betadine and saline   Amount of cleaning:  Standard Skin repair:    Repair method:  Sutures   Suture size:  4-0   Suture material:  Nylon   Suture technique:  Simple interrupted   Number of sutures:  6 Post-procedure details:    Dressing:  Antibiotic ointment   Patient tolerance of procedure:  Tolerated well, no immediate complications    Critical Care performed: No  ____________________________________________  INITIAL IMPRESSION / ASSESSMENT AND PLAN / ED COURSE  Pertinent labs & imaging results that were available during my care of the patient were reviewed by me and considered in my medical decision making (see chart for details).  This is a 83 year old man who comes from his nursing home with a history of dementia and an unwitnessed fall. The patient has a laceration. I will repair the patient's laceration and sent him for some blood work and a CT scan. The patient will be reassessed.     I repaired the patient's wound. The patient's blood work is unremarkable. His troponin was initially 0.05 so we didn't repeat it and it was 0.06. The patient will be discharged to follow-up with his primary care physician. The patient should have his sutures removed in approximately  5-7 days.  ____________________________________________   FINAL CLINICAL IMPRESSION(S) / ED DIAGNOSES  Final diagnoses:  Facial laceration, initial encounter  Fall, initial encounter      NEW MEDICATIONS STARTED DURING THIS VISIT:  New Prescriptions   BACITRACIN OINTMENT    Apply to affected area daily     Note:  This document was prepared using Dragon voice recognition software and may include unintentional dictation errors.    Rebecka Apley, MD 05/09/17 [id number]

## 2017-07-14 ENCOUNTER — Ambulatory Visit (INDEPENDENT_AMBULATORY_CARE_PROVIDER_SITE_OTHER): Payer: Medicare Other | Admitting: Vascular Surgery

## 2017-07-14 ENCOUNTER — Encounter (INDEPENDENT_AMBULATORY_CARE_PROVIDER_SITE_OTHER): Payer: Self-pay | Admitting: Vascular Surgery

## 2017-07-14 VITALS — Resp 14 | Ht 72.0 in | Wt 160.0 lb

## 2017-07-14 DIAGNOSIS — I7025 Atherosclerosis of native arteries of other extremities with ulceration: Secondary | ICD-10-CM

## 2017-07-14 DIAGNOSIS — I2510 Atherosclerotic heart disease of native coronary artery without angina pectoris: Secondary | ICD-10-CM

## 2017-07-14 DIAGNOSIS — Z89612 Acquired absence of left leg above knee: Secondary | ICD-10-CM

## 2017-07-14 DIAGNOSIS — I10 Essential (primary) hypertension: Secondary | ICD-10-CM | POA: Diagnosis not present

## 2017-07-14 NOTE — Progress Notes (Signed)
MRN : [id number]  Jack French is a 83 y.o. (25-Nov-1923) male who presents with chief complaint of  Chief Complaint  Patient presents with  . Follow-up    3 month no studies  .  History of Present Illness: The patient returns to the office for followup of his peripheral arterial disease. There has been a significant deterioration in the lower extremity symptoms and he now has multiple ulcers in the right ankle and shin area.    He is now also complaining of pain in his right leg which is new compared to his last visit  There have been no significant changes to the patient's overall health care.  His dementia is quite advanced and since his last visit he has been seen in the emergency room after significant fall where he sustained a head laceration  The patient denies amaurosis fugax or recent TIA symptoms. There are no recent neurological changes noted. The patient denies history of DVT, PE or superficial thrombophlebitis. The patient denies recent episodes of angina or shortness of breath.     Current Meds  Medication Sig  . bacitracin ointment Apply to affected area daily  . carvedilol (COREG) 12.5 MG tablet Take 12.5 mg by mouth 2 (two) times daily with a meal.   . divalproex (DEPAKOTE ER) 250 MG 24 hr tablet Take 250 mg by mouth daily.   Marland Kitchen. senna-docusate (SENOKOT-S) 8.6-50 MG tablet Take 1 tablet by mouth at bedtime as needed.     Past Medical History:  Diagnosis Date  . Chronic kidney disease   . Dementia   . Diabetes mellitus without complication (HCC)   . Hypertension   . Peripheral vascular disease [facility](HCC)     Past Surgical History:  Procedure Laterality Date  . ABOVE KNEE LEG AMPUTATION Left   . TRICEPS TENDON REPAIR      Social History Social History  Substance Use Topics  . Smoking status: Former Games developermoker  . Smokeless tobacco: Never Used  . Alcohol use No    Family History No family history on file.  Allergies  Allergen Reactions  . Penicillin G  Rash     REVIEW OF SYSTEMS (Negative unless checked)  Constitutional: [] Weight loss  [] Fever  [] Chills Cardiac: [] Chest pain   [] Chest pressure   [] Palpitations   [] Shortness of breath when laying flat   [] Shortness of breath with exertion. Vascular:  [x] Pain in legs with walking   [x] Pain in legs at rest  [] History of DVT   [] Phlebitis   [] Swelling in legs   [] Varicose veins   [x] Non-healing ulcers Pulmonary:   [] Uses home oxygen   [] Productive cough   [] Hemoptysis   [] Wheeze  [] COPD   [] Asthma Neurologic:  [] Dizziness   [] Seizures   [] History of stroke   [] History of TIA  [] Aphasia   [] Vissual changes   [] Weakness or numbness in arm   [] Weakness or numbness in leg Musculoskeletal:   [] Joint swelling   [] Joint pain   [] Low back pain Hematologic:  [] Easy bruising  [] Easy bleeding   [] Hypercoagulable state   [] Anemic Gastrointestinal:  [] Diarrhea   [] Vomiting  [] Gastroesophageal reflux/heartburn   [] Difficulty swallowing. Genitourinary:  [] Chronic kidney disease   [] Difficult urination  [] Frequent urination   [] Blood in urine Skin:  [] Rashes   [] Ulcers  Psychological:  [] History of anxiety   []  History of major depression.  Physical Examination  Vitals:   07/14/17 1332  Resp: 14  Weight: 160 lb (72.6 kg)  Height: 6' (1.829  m)   Body mass index is 21.7 kg/m. Gen: WD/WN, NAD Head: [place]/AT, No temporalis wasting.  Ear/Nose/Throat: Hearing grossly intact, nares w/o erythema or drainage Eyes: PER, EOMI, sclera nonicteric.  Neck: Supple, no large masses.   Pulmonary:  Good air movement, no audible wheezing bilaterally, no use of accessory muscles.  Cardiac: Irregularly irregular, no JVD, pacemaker left side Vascular: Multiple ulcerations right ankle shin area Vessel Right Left  Radial Palpable Palpable  PT Not Palpable AKA  DP Not Palpable AKA  Gastrointestinal: Non-distended. No guarding/no peritoneal signs.  Musculoskeletal: M/S 5/5 throughout both arms and right leg. Left AKA with  atrophy right leg and calf.  Neurologic: CN 2-12 intact. Symmetrical.  Speech is fluent. Motor exam as listed above. Psychiatric: Judgment intact, Mood & affect appropriate for pt's clinical situation. Dermatologic: Moderate venous rashes with right ankle and shin ulcers noted.  No changes consistent with cellulitis. Lymph : No lichenification or skin changes of chronic lymphedema.  CBC Lab Results  Component Value Date   WBC 4.9 05/09/2017   HGB 12.2 (L) 05/09/2017   HCT 36.0 (L) 05/09/2017   MCV 91.6 05/09/2017   PLT 150 05/09/2017    BMET    Component Value Date/Time   NA 134 (L) 05/09/2017 0046   NA 139 12/25/2014 0817   K 4.7 05/09/2017 0046   K 4.7 12/25/2014 0817   CL 104 05/09/2017 0046   CL 112 (H) 12/25/2014 0817   CO2 25 05/09/2017 0046   CO2 22 12/25/2014 0817   GLUCOSE 93 05/09/2017 0046   GLUCOSE 108 (H) 12/25/2014 0817   BUN 37 (H) 05/09/2017 0046   BUN 48 (H) 12/25/2014 0817   CREATININE 1.93 (H) 05/09/2017 0046   CREATININE 1.80 (H) 12/25/2014 0817   CALCIUM 8.3 (L) 05/09/2017 0046   CALCIUM 7.5 (L) 12/25/2014 0817   GFRNONAA 29 (L) 05/09/2017 0046   GFRNONAA 33 (L) 12/25/2014 0817   GFRAA 34 (L) 05/09/2017 0046   GFRAA 38 (L) 12/25/2014 0817   CrCl cannot be calculated (Patient's most recent lab result is older than the maximum 21 days allowed.).  COAG Lab Results  Component Value Date   INR 1.1 10/25/2013    Radiology No results found.  Assessment/Plan 1. Atherosclerosis of native arteries of the extremities with ulceration (HCC)  Recommend:  The patient has evidence of severe atherosclerotic changes of both lower extremities associated with ulceration and tissue loss of the right ankle and shin.  This represents a limb threatening ischemia and places the patient at the risk for limb loss.  I have spoken at length with the patient's daughter Jack French.  She is aware of the changes compared to his last visit and my recommendations for  angiography.  Patient should undergo angiography of the right lower extremity with the hope for intervention for limb salvage.  The risks and benefits as well as the alternative therapies was discussed in detail with the patient.  All questions were answered.    He apparently did not tolerate conscious sedation in the past and therefore will need anesthesia.  Given this fact and his daughter's concerns I will as Dr. Celene Kras to evaluate him for cardiac clearance.  And we will move forward after his cardiology evaluation.  The patient will follow up with me in the office after the procedure.   A total of 30 minutes was spent with this patient's care giver and his daughter (POA) and greater than 50% was spent in counseling and coordination  of care with the patient.  Discussion included the treatment options for vascular disease including indications for surgery and intervention.  Also discussed is the appropriate timing of treatment.  In addition medical therapy was discussed.     - Ambulatory referral to Cardiology  2. Status post above knee amputation of left lower extremity (HCC) Given his comorbidities he is not a candidate for an eye prosthesis  3. Essential hypertension Continue antihypertensive medications as already ordered, these medications have been reviewed and there are no changes at this time.   4. Coronary artery disease involving native heart, angina presence unspecified, unspecified vessel or lesion type Continue cardiac and antihypertensive medications as already ordered and reviewed, no changes at this time.  Continue statin as ordered and reviewed, no changes at this time  Nitrates PRN for chest pain     Levora Dredge, MD  07/14/2017 2:11 PM

## 2017-08-29 ENCOUNTER — Ambulatory Visit: Payer: Medicare Other | Admitting: Surgery

## 2017-09-02 ENCOUNTER — Encounter: Payer: Medicare Other | Attending: Surgery | Admitting: Surgery

## 2017-09-06 NOTE — Progress Notes (Signed)
Jack French, Baltazar G. ([id number]) Visit Report for 09/02/2017 Arrival Information Details Patient Name: Jack French, Jack G. Date of Service: 09/02/2017 12:30 PM Medical Record Number: [id number] Patient Account Number: [id number] Date of Birth/Sex: [birth year](83 y.o. Male) Treating RN: Renne CriglerFlinchum, Cheryl Primary Care Janin Kozlowski: Oretha MilchSMITH, SEAN Other Clinician: Referring Gari Trovato: Oretha MilchSMITH, SEAN Treating Jaevon Paras/Extender: Rudene ReBritto, Errol Weeks in Treatment: 0 Visit Information Patient Arrived: Wheel Chair Arrival Time: 12:44 Accompanied By: caregiver Transfer Assistance: EasyPivot Patient Lift Patient Identification Verified: Yes Secondary Verification Process Yes Completed: Electronic Signature(s) Signed: 09/02/2017 4:46:19 PM By: Renne CriglerFlinchum, Cheryl Entered By: Renne CriglerFlinchum, Cheryl on 09/02/2017 12:50:17 Ruperto, Casandra DoffingBOLIVAR G. ([id number]) -------------------------------------------------------------------------------- Encounter Discharge Information Details Patient Name: Jack EgePEREIRA, Jack G. Date of Service: 09/02/2017 12:30 PM Medical Record Number: [id number] Patient Account Number: [id number] Date of Birth/Sex: [birth year](83 y.o. Male) Treating RN: Renne CriglerFlinchum, Cheryl Primary Care Celica Kotowski: Oretha MilchSMITH, SEAN Other Clinician: Referring Beatrice Sehgal: Oretha MilchSMITH, SEAN Treating Johndavid Geralds/Extender: Rudene ReBritto, Errol Weeks in Treatment: 0 Encounter Discharge Information Items Schedule Follow-up Appointment: No Medication Reconciliation completed and No provided to Patient/Care Tonee Silverstein: Patient Clinical Summary of Care: Declined Electronic Signature(s) Signed: 09/06/2017 11:15:46 AM By: Gwenlyn PerkingMoore, Shelia Entered By: Gwenlyn PerkingMoore, Shelia on 09/02/2017 13:55:43 Smithhart, Casandra DoffingBOLIVAR G. ([id number]) -------------------------------------------------------------------------------- Pain Assessment Details Patient Name: Jack EgePEREIRA, Jack G. Date of Service: 09/02/2017 12:30 PM Medical Record Number: [id number] Patient Account Number:  [id number] Date of Birth/Sex: [birth year](83 y.o. Male) Treating RN: Renne CriglerFlinchum, Cheryl Primary Care Ario Mcdiarmid: Oretha MilchSMITH, SEAN Other Clinician: Referring Zadiel Leyh: Oretha MilchSMITH, SEAN Treating Latrell Reitan/Extender: Rudene ReBritto, Errol Weeks in Treatment: 0 Active Problems Location of Pain Severity and Description of Pain Patient Has Paino Patient Unable to Respond Site Locations Duration of the Pain. Constant / Intermittento Constant Rate the pain. Current Pain Level: 8 Pain Management and Medication Current Pain Management: Electronic Signature(s) Signed: 09/02/2017 4:46:19 PM By: Renne CriglerFlinchum, Cheryl Entered By: Renne CriglerFlinchum, Cheryl on 09/02/2017 12:53:16 Dicicco, Casandra DoffingBOLIVAR G. ([id number]) -------------------------------------------------------------------------------- Wound Assessment Details Patient Name: Jack EgePEREIRA, Jack G. Date of Service: 09/02/2017 12:30 PM Medical Record Number: [id number] Patient Account Number: [id number] Date of Birth/Sex: [birth year](83 y.o. Male) Treating RN: Renne CriglerFlinchum, Cheryl Primary Care Krue Peterka: Oretha MilchSMITH, SEAN Other Clinician: Referring Kamareon Sciandra: Oretha MilchSMITH, SEAN Treating Monroe Qin/Extender: Rudene ReBritto, Errol Weeks in Treatment: 0 Wound Status Wound Number: 1 Primary Etiology: Trauma, Other Wound Location: Right Calcaneus Wound Status: Open Wounding Event: Gradually Appeared Date Acquired: 05/03/2017 Weeks Of Treatment: 0 Clustered Wound: No Wound Measurements Length: (cm) 7.5 Width: (cm) 4.5 Depth: (cm) 0.5 Area: (cm) 26.507 Volume: (cm) 13.254 % Reduction in Area: 0% % Reduction in Volume: 0% Epithelialization: Large (67-100%) Tunneling: No Undermining: No Wound Description Classification: Unclassifiable Wound Margin: Indistinct, nonvisible Exudate Amount: Large Exudate Type: Serosanguineous Exudate Color: red, brown Foul Odor After Cleansing: No Slough/Fibrino Yes Wound Bed Granulation Amount: None Present (0%) Exposed Structure Necrotic Amount: Large  (67-100%) Fascia Exposed: No Necrotic Quality: Eschar Fat Layer (Subcutaneous Tissue) Exposed: No Tendon Exposed: No Muscle Exposed: No Joint Exposed: No Bone Exposed: No Periwound Skin Texture Texture Color No Abnormalities Noted: No No Abnormalities Noted: No Callus: Yes Atrophie Blanche: No Crepitus: No Cyanosis: No Excoriation: No Ecchymosis: No Induration: Yes Erythema: Yes Rash: No Erythema Location: Circumferential Scarring: Yes Hemosiderin Staining: Yes Mottled: No Moisture Pallor: No No Abnormalities Noted: No Rubor: No Dry / Scaly: No Maceration: No Temperature / Pain Temperature: No Abnormality Cocco, Tyrease G. ([id number]) Tenderness on Palpation: Yes Wound Preparation Ulcer Cleansing: Rinsed/Irrigated with Saline Topical Anesthetic Applied: Other: lidocaine 4%, Electronic Signature(s) Signed: 09/02/2017 4:46:19 PM By: Renne CriglerFlinchum, Cheryl Entered By: Renne CriglerFlinchum, Cheryl on 09/02/2017 13:22:46 Jack French ([id number]) -------------------------------------------------------------------------------- Vitals Details Patient Name: Jack French. Date of Service: 09/02/2017 12:30 PM Medical Record Number: [id number] Patient Account Number: [id number] Date of Birth/Sex: Oct 26, 1923 (83 y.o. Male) Treating RN: Renne Crigler Primary Care Vayda Dungee: Oretha Milch Other Clinician: Referring Demarques Pilz: Oretha Milch Treating Jessa Stinson/Extender: Rudene Re in Treatment: 0 Vital Signs Time Taken: 12:53 Temperature (F): 98.2 Pulse (bpm): 75 Respiratory Rate (breaths/min): 18 Blood Pressure (mmHg): 106/60 Reference Range: 80 - 120 mg / dl Electronic Signature(s) Signed: 09/02/2017 4:46:19 PM By: Renne Crigler Entered By: Renne Crigler on 09/02/2017 12:53:52

## 2017-09-09 ENCOUNTER — Ambulatory Visit: Payer: Medicare Other | Admitting: Physician Assistant

## 2017-09-10 ENCOUNTER — Emergency Department: Payer: Medicare Other

## 2017-09-10 ENCOUNTER — Inpatient Hospital Stay (HOSPITAL_COMMUNITY)
Admit: 2017-09-10 | Discharge: 2017-09-10 | Disposition: A | Discharge status: Home / Self Care | Payer: Medicare Other | Attending: Internal Medicine | Admitting: Internal Medicine

## 2017-09-10 ENCOUNTER — Encounter: Payer: Self-pay | Admitting: *Deleted

## 2017-09-10 ENCOUNTER — Other Ambulatory Visit: Payer: Self-pay

## 2017-09-10 ENCOUNTER — Inpatient Hospital Stay
Admission: EM | Admit: 2017-09-10 | Discharge: 2017-09-14 | DRG: 064 | Disposition: A | Discharge status: Hospice | Payer: Medicare Other | Attending: Internal Medicine | Admitting: Internal Medicine

## 2017-09-10 ENCOUNTER — Inpatient Hospital Stay: Payer: Medicare Other

## 2017-09-10 DIAGNOSIS — F0150 Vascular dementia without behavioral disturbance: Secondary | ICD-10-CM

## 2017-09-10 DIAGNOSIS — Z515 Encounter for palliative care: Secondary | ICD-10-CM | POA: Diagnosis not present

## 2017-09-10 DIAGNOSIS — I639 Cerebral infarction, unspecified: Secondary | ICD-10-CM | POA: Diagnosis present

## 2017-09-10 DIAGNOSIS — J690 Pneumonitis due to inhalation of food and vomit: Secondary | ICD-10-CM | POA: Diagnosis present

## 2017-09-10 DIAGNOSIS — E870 Hyperosmolality and hypernatremia: Secondary | ICD-10-CM | POA: Diagnosis present

## 2017-09-10 DIAGNOSIS — Z89612 Acquired absence of left leg above knee: Secondary | ICD-10-CM | POA: Diagnosis not present

## 2017-09-10 DIAGNOSIS — I6202 Nontraumatic subacute subdural hemorrhage: Secondary | ICD-10-CM | POA: Diagnosis present

## 2017-09-10 DIAGNOSIS — N179 Acute kidney failure, unspecified: Secondary | ICD-10-CM | POA: Diagnosis present

## 2017-09-10 DIAGNOSIS — Z66 Do not resuscitate: Secondary | ICD-10-CM | POA: Diagnosis present

## 2017-09-10 DIAGNOSIS — Z87891 Personal history of nicotine dependence: Secondary | ICD-10-CM

## 2017-09-10 DIAGNOSIS — Z79899 Other long term (current) drug therapy: Secondary | ICD-10-CM

## 2017-09-10 DIAGNOSIS — S065X9A Traumatic subdural hemorrhage with loss of consciousness of unspecified duration, initial encounter: Secondary | ICD-10-CM | POA: Diagnosis not present

## 2017-09-10 DIAGNOSIS — R29700 NIHSS score 0: Secondary | ICD-10-CM | POA: Diagnosis present

## 2017-09-10 DIAGNOSIS — L89512 Pressure ulcer of right ankle, stage 2: Secondary | ICD-10-CM | POA: Diagnosis present

## 2017-09-10 DIAGNOSIS — A419 Sepsis, unspecified organism: Secondary | ICD-10-CM | POA: Diagnosis present

## 2017-09-10 DIAGNOSIS — E860 Dehydration: Secondary | ICD-10-CM | POA: Diagnosis present

## 2017-09-10 DIAGNOSIS — F0151 Vascular dementia with behavioral disturbance: Secondary | ICD-10-CM | POA: Diagnosis present

## 2017-09-10 DIAGNOSIS — Z880 Allergy status to penicillin: Secondary | ICD-10-CM

## 2017-09-10 DIAGNOSIS — I129 Hypertensive chronic kidney disease with stage 1 through stage 4 chronic kidney disease, or unspecified chronic kidney disease: Secondary | ICD-10-CM | POA: Diagnosis present

## 2017-09-10 DIAGNOSIS — E43 Unspecified severe protein-calorie malnutrition: Secondary | ICD-10-CM | POA: Diagnosis present

## 2017-09-10 DIAGNOSIS — S065XAA Traumatic subdural hemorrhage with loss of consciousness status unknown, initial encounter: Secondary | ICD-10-CM

## 2017-09-10 DIAGNOSIS — I63519 Cerebral infarction due to unspecified occlusion or stenosis of unspecified middle cerebral artery: Secondary | ICD-10-CM | POA: Diagnosis not present

## 2017-09-10 DIAGNOSIS — E1151 Type 2 diabetes mellitus with diabetic peripheral angiopathy without gangrene: Secondary | ICD-10-CM | POA: Diagnosis present

## 2017-09-10 DIAGNOSIS — N184 Chronic kidney disease, stage 4 (severe): Secondary | ICD-10-CM | POA: Diagnosis present

## 2017-09-10 DIAGNOSIS — G9341 Metabolic encephalopathy: Secondary | ICD-10-CM | POA: Diagnosis present

## 2017-09-10 DIAGNOSIS — Z681 Body mass index (BMI) 19 or less, adult: Secondary | ICD-10-CM | POA: Diagnosis not present

## 2017-09-10 DIAGNOSIS — E1122 Type 2 diabetes mellitus with diabetic chronic kidney disease: Secondary | ICD-10-CM | POA: Diagnosis present

## 2017-09-10 DIAGNOSIS — Y95 Nosocomial condition: Secondary | ICD-10-CM | POA: Diagnosis present

## 2017-09-10 DIAGNOSIS — I248 Other forms of acute ischemic heart disease: Secondary | ICD-10-CM | POA: Diagnosis present

## 2017-09-10 DIAGNOSIS — I340 Nonrheumatic mitral (valve) insufficiency: Secondary | ICD-10-CM | POA: Diagnosis not present

## 2017-09-10 DIAGNOSIS — L89610 Pressure ulcer of right heel, unstageable: Secondary | ICD-10-CM | POA: Diagnosis present

## 2017-09-10 DIAGNOSIS — R7989 Other specified abnormal findings of blood chemistry: Secondary | ICD-10-CM

## 2017-09-10 DIAGNOSIS — L8990 Pressure ulcer of unspecified site, unspecified stage: Secondary | ICD-10-CM

## 2017-09-10 DIAGNOSIS — R4182 Altered mental status, unspecified: Secondary | ICD-10-CM

## 2017-09-10 DIAGNOSIS — R778 Other specified abnormalities of plasma proteins: Secondary | ICD-10-CM

## 2017-09-10 LAB — COMPREHENSIVE METABOLIC PANEL
ALBUMIN: 2.5 g/dL — AB (ref 3.5–5.0)
ALT: 22 U/L (ref 17–63)
ANION GAP: 9 (ref 5–15)
AST: 34 U/L (ref 15–41)
Alkaline Phosphatase: 63 U/L (ref 38–126)
BILIRUBIN TOTAL: 0.9 mg/dL (ref 0.3–1.2)
BUN: 70 mg/dL — ABNORMAL HIGH (ref 6–20)
CALCIUM: 8.2 mg/dL — AB (ref 8.9–10.3)
CO2: 23 mmol/L (ref 22–32)
Chloride: 117 mmol/L — ABNORMAL HIGH (ref 101–111)
Creatinine, Ser: 2.68 mg/dL — ABNORMAL HIGH (ref 0.61–1.24)
GFR, EST AFRICAN AMERICAN: 22 mL/min — AB (ref >60)
GFR, EST NON AFRICAN AMERICAN: 19 mL/min — AB (ref >60)
Glucose, Bld: 109 mg/dL — ABNORMAL HIGH (ref 65–99)
POTASSIUM: 4.1 mmol/L (ref 3.5–5.1)
Sodium: 149 mmol/L — ABNORMAL HIGH (ref 135–145)
TOTAL PROTEIN: 5.9 g/dL — AB (ref 6.5–8.1)

## 2017-09-10 LAB — ECHOCARDIOGRAM COMPLETE
Area-P 1/2: 4.15 cm2
EERAT: 6.21
EWDT: 180 ms
FS: 17 % — AB (ref 28–44)
Height: 72 in
IV/PV OW: 1.44
LA ID, A-P, ES: 38 mm
LA diam end sys: 38 mm
LADIAMINDEX: 2.37 cm/m2
LV E/e' medial: 6.21
LV E/e'average: 6.21
LV TDI E'MEDIAL: 3.37
LV e' LATERAL: 7.18 cm/s
Lateral S' vel: 10.9 cm/s
MV Dec: 180
MV pk E vel: 44.6 m/s
MVPKAVEL: 121 m/s
MVSPHT: 53 ms
P 1/2 time: 1113 ms
PW: 9.58 mm — AB (ref 0.6–1.1)
TDI e' lateral: 7.18
Weight: 1840 oz

## 2017-09-10 LAB — CBC WITH DIFFERENTIAL/PLATELET
BASOS PCT: 0 %
Basophils Absolute: 0 10*3/uL (ref 0–0.1)
Eosinophils Absolute: 0 10*3/uL (ref 0–0.7)
Eosinophils Relative: 0 %
HCT: 39.2 % — ABNORMAL LOW (ref 40.0–52.0)
Hemoglobin: 12.6 g/dL — ABNORMAL LOW (ref 13.0–18.0)
LYMPHS ABS: 1.1 10*3/uL (ref 1.0–3.6)
Lymphocytes Relative: 14 %
MCH: 28.6 pg (ref 26.0–34.0)
MCHC: 32 g/dL (ref 32.0–36.0)
MCV: 89.4 fL (ref 80.0–100.0)
MONO ABS: 0.5 10*3/uL (ref 0.2–1.0)
MONOS PCT: 6 %
NEUTROS ABS: 6.5 10*3/uL (ref 1.4–6.5)
Neutrophils Relative %: 80 %
Platelets: 209 10*3/uL (ref 150–440)
RBC: 4.39 MIL/uL — ABNORMAL LOW (ref 4.40–5.90)
RDW: 14.9 % — AB (ref 11.5–14.5)
WBC: 8.1 10*3/uL (ref 3.8–10.6)

## 2017-09-10 LAB — LIPID PANEL
CHOL/HDL RATIO: 6.3 ratio
CHOLESTEROL: 157 mg/dL (ref 0–200)
HDL: 25 mg/dL — AB (ref >40)
LDL Cholesterol: 111 mg/dL — ABNORMAL HIGH (ref 0–99)
TRIGLYCERIDES: 103 mg/dL (ref <150)
VLDL: 21 mg/dL (ref 0–40)

## 2017-09-10 LAB — HEMOGLOBIN A1C
Hgb A1c MFr Bld: 5.9 % — ABNORMAL HIGH (ref 4.8–5.6)
Mean Plasma Glucose: 122.63 mg/dL

## 2017-09-10 LAB — TROPONIN I
TROPONIN I: 0.14 ng/mL — AB (ref <0.03)
Troponin I: 0.13 ng/mL (ref <0.03)
Troponin I: 0.09 ng/mL (ref <0.03)
Troponin I: 0.1 ng/mL (ref <0.03)

## 2017-09-10 LAB — LACTIC ACID, PLASMA
LACTIC ACID, VENOUS: 2 mmol/L — AB (ref 0.5–1.9)
LACTIC ACID, VENOUS: 2.1 mmol/L — AB (ref 0.5–1.9)

## 2017-09-10 LAB — GLUCOSE, CAPILLARY
GLUCOSE-CAPILLARY: 117 mg/dL — AB (ref 65–99)
GLUCOSE-CAPILLARY: 107 mg/dL — AB (ref 65–99)
GLUCOSE-CAPILLARY: 96 mg/dL (ref 65–99)

## 2017-09-10 LAB — MAGNESIUM: MAGNESIUM: 2.2 mg/dL (ref 1.7–2.4)

## 2017-09-10 LAB — MRSA PCR SCREENING: MRSA by PCR: NEGATIVE

## 2017-09-10 MED ORDER — STROKE: EARLY STAGES OF RECOVERY BOOK
Freq: Once | Status: AC
  Administered 2017-09-10: 05:00:00

## 2017-09-10 MED ORDER — INSULIN ASPART 100 UNIT/ML ~~LOC~~ SOLN: 0.0000 [IU] | Freq: Four times a day (QID) | SUBCUTANEOUS | Status: DC

## 2017-09-10 MED ORDER — ONDANSETRON HCL 4 MG/2ML IJ SOLN: 4.0000 mg | Freq: Four times a day (QID) | INTRAMUSCULAR | Status: DC | PRN

## 2017-09-10 MED ORDER — ASPIRIN 325 MG PO TABS
325.0000 mg | ORAL_TABLET | Freq: Every day | ORAL | Status: DC
  Filled 2017-09-10 (×3): qty 1

## 2017-09-10 MED ORDER — SODIUM CHLORIDE 0.9 % IV SOLN
500.0000 mg | Freq: Two times a day (BID) | INTRAVENOUS | Status: DC
  Administered 2017-09-10 (×3): 500 mg via INTRAVENOUS
  Filled 2017-09-10 (×4): qty 0.5

## 2017-09-10 MED ORDER — SODIUM CHLORIDE 0.9 % IV BOLUS (SEPSIS)
1000.0000 mL | Freq: Once | INTRAVENOUS | Status: AC
  Administered 2017-09-10: 06:00:00 1000 mL via INTRAVENOUS

## 2017-09-10 MED ORDER — ASPIRIN 300 MG RE SUPP
300.0000 mg | Freq: Once | RECTAL | Status: AC
  Administered 2017-09-10: 300 mg via RECTAL
  Filled 2017-09-10: qty 1

## 2017-09-10 MED ORDER — ACETAMINOPHEN 325 MG PO TABS: 650.0000 mg | ORAL_TABLET | Freq: Four times a day (QID) | ORAL | Status: DC | PRN

## 2017-09-10 MED ORDER — ONDANSETRON HCL 4 MG PO TABS: 4.0000 mg | ORAL_TABLET | Freq: Four times a day (QID) | ORAL | Status: DC | PRN

## 2017-09-10 MED ORDER — ACETAMINOPHEN 325 MG PO TABS: 650.0000 mg | ORAL_TABLET | ORAL | Status: DC | PRN

## 2017-09-10 MED ORDER — SODIUM CHLORIDE 0.9 % IV BOLUS (SEPSIS)
1000.0000 mL | Freq: Once | INTRAVENOUS | Status: AC
Start: 2017-09-10 — End: 2017-09-10
  Administered 2017-09-10: 1000 mL via INTRAVENOUS

## 2017-09-10 MED ORDER — SODIUM CHLORIDE 0.9 % IV SOLN: INTRAVENOUS | Status: DC

## 2017-09-10 MED ORDER — SENNOSIDES-DOCUSATE SODIUM 8.6-50 MG PO TABS: 1.0000 | ORAL_TABLET | Freq: Every evening | ORAL | Status: DC | PRN

## 2017-09-10 MED ORDER — BACITRACIN ZINC 500 UNIT/GM EX OINT
TOPICAL_OINTMENT | Freq: Two times a day (BID) | CUTANEOUS | Status: DC
  Filled 2017-09-10: qty 28.35

## 2017-09-10 MED ORDER — INSULIN ASPART 100 UNIT/ML ~~LOC~~ SOLN: 0.0000 [IU] | Freq: Three times a day (TID) | SUBCUTANEOUS | Status: DC

## 2017-09-10 MED ORDER — SODIUM CHLORIDE 0.9 % IV SOLN
INTRAVENOUS | Status: DC
  Administered 2017-09-10 (×2): via INTRAVENOUS

## 2017-09-10 MED ORDER — INSULIN ASPART 100 UNIT/ML ~~LOC~~ SOLN
0.0000 [IU] | Freq: Four times a day (QID) | SUBCUTANEOUS | Status: DC
  Administered 2017-09-11 – 2017-09-12 (×2): 1 [IU] via SUBCUTANEOUS
  Filled 2017-09-10 (×2): qty 1

## 2017-09-10 MED ORDER — ACETAMINOPHEN 160 MG/5ML PO SOLN
650.0000 mg | ORAL | Status: DC | PRN
  Filled 2017-09-10: qty 20.3

## 2017-09-10 MED ORDER — ASPIRIN 300 MG RE SUPP
300.0000 mg | Freq: Every day | RECTAL | Status: DC
  Administered 2017-09-10 – 2017-09-12 (×3): 300 mg via RECTAL
  Filled 2017-09-10 (×3): qty 1

## 2017-09-10 MED ORDER — ACETAMINOPHEN 650 MG RE SUPP: 650.0000 mg | RECTAL | Status: DC | PRN

## 2017-09-10 MED ORDER — DIVALPROEX SODIUM ER 250 MG PO TB24
250.0000 mg | ORAL_TABLET | Freq: Every day | ORAL | Status: DC
  Filled 2017-09-10 (×3): qty 1

## 2017-09-10 MED ORDER — ACETAMINOPHEN 650 MG RE SUPP: 650.0000 mg | Freq: Four times a day (QID) | RECTAL | Status: DC | PRN

## 2017-09-10 MED ORDER — DEXTROSE 5 % IV SOLN: 2.0000 g | Freq: Once | INTRAVENOUS | Status: DC

## 2017-09-10 MED ORDER — VANCOMYCIN HCL IN DEXTROSE 1-5 GM/200ML-% IV SOLN
1000.0000 mg | Freq: Once | INTRAVENOUS | Status: AC
  Administered 2017-09-10: 05:00:00 1000 mg via INTRAVENOUS
  Filled 2017-09-10: qty 200

## 2017-09-10 MED ORDER — INSULIN ASPART 100 UNIT/ML ~~LOC~~ SOLN: 0.0000 [IU] | Freq: Every day | SUBCUTANEOUS | Status: DC

## 2017-09-10 MED ORDER — SODIUM CHLORIDE 0.9 % IV BOLUS (SEPSIS)
250.0000 mL | Freq: Once | INTRAVENOUS | Status: AC
  Administered 2017-09-10: 07:00:00 250 mL via INTRAVENOUS

## 2017-09-10 MED ORDER — SODIUM CHLORIDE 0.9 % IV BOLUS (SEPSIS)
500.0000 mL | Freq: Once | INTRAVENOUS | Status: AC
  Administered 2017-09-10: 500 mL via INTRAVENOUS

## 2017-09-10 NOTE — ED Notes (Signed)
This RN spoke with patient's daughter, Cammy CopaHaydee Roman, per MD Dolores FrameSung request. Patient's daughter, [name], verbalized to this RN that she would like her father to be a DNR (Do Not Resuscitate)

## 2017-09-10 NOTE — Progress Notes (Signed)
Pharmacy Antibiotic Note  Andres EgeBolivar G Scarber is a 83 y.o. male admitted on 09/10/2017 with sepsis.  Pharmacy has been consulted for vancomycin dosing.  SCr = 2.7 which is elevated from baseline  Plan: Given AKI, unable to accurately determine renal function with CrCl. Patient received vancomycin 1000 mg dose this morning (~20 mg/kg).  Will order a 24 hour vancomycin level and redose if level <15-20 mcg/mL  Height: 6' (182.9 cm) Weight: 115 lb (52.2 kg) IBW/kg (Calculated) : 77.6  Temp (24hrs), Avg:99 F (37.2 C), Min:99 F (37.2 C), Max:99 F (37.2 C)  Recent Labs  Lab 09/10/17 0135 09/10/17 0139 09/10/17 0307  WBC 8.1  --   --   CREATININE 2.68*  --   --   LATICACIDVEN  --  2.0* 2.1*    Estimated Creatinine Clearance: 13.3 mL/min (A) (by C-G formula based on SCr of 2.68 mg/dL (H)).    Allergies  Allergen Reactions  . Penicillin G Rash   Antimicrobials this admission: vancomycin 12/22 >>  meropenem 12/22 >>   Dose adjustments this admission:  Microbiology results: 12/22 BCx: No growth < 12 hours 12/22 MRSA PCR: Negative  Thank you for allowing pharmacy to be a part of this patient's care.  Cindi CarbonMary M Dario Yono, PharmD, BCPS Clinical Pharmacist 09/10/2017 7:33 AM

## 2017-09-10 NOTE — Progress Notes (Signed)
OT Cancellation Note  Patient Details Name: Jack French MRN: [id number] DOB: 09-25-1923   Cancelled Treatment:    Reason Eval/Treat Not Completed: OT screened, OT services are not warranted at this time. Pt. Planning to transition to Hospice Care services. will sign off   Olegario MessierElaine Nycole Kawahara, MS, OTR/L 09/10/2017, 1:51 PM

## 2017-09-10 NOTE — Progress Notes (Signed)
PT Cancellation Note  Patient Details Name: Jack French Modisette MRN: [id number] DOB: February 24, 1924   Cancelled Treatment:    Reason Eval/Treat Not Completed: Other (comment). Consult received. Per RN, pt not appropriate for therapy. Palliative involved for possible hospice. Will cancel current order. Please re-order if needs change.   Aya Geisel 09/10/2017, 10:47 AM Elizabeth PalauStephanie Jazae Gandolfi, PT, DPT [phone]

## 2017-09-10 NOTE — ED Provider Notes (Signed)
 [facility] Emergency Department Provider Note   ____________________________________________   First MD Initiated Contact with Patient 09/10/17 [phone]     (approximate)  I have reviewed the triage vital signs and the nursing notes.   HISTORY  Chief Complaint Altered Mental Status  History limited by nonverbal state  HPI Jack French is a 83 y.o. male sent to the ED from [facility] health care with a chief complaint of altered mentation.  Per EMS, staff at the nursing home report patient has been declining and nonverbal for the past 1-2 days.  Reportedly his daughter did not want him sent for evaluation.  Nursing home staff received orders from patient's doctor for IV fluids.  They were unable to start an IV.  Also reports patient typically verbal and responsive.  Rest of history is limited by patient's nonverbal state.   Past Medical History:  Diagnosis Date  . Chronic kidney disease   . Dementia   . Diabetes mellitus without complication (HCC)   . Hypertension   . Peripheral vascular disease [facility])     Patient Active Problem List   Diagnosis Date Noted  . Sepsis (HCC) 09/10/2017  . Atherosclerosis of native arteries of the extremities with ulceration (HCC) 07/08/2016  . Status post above knee amputation (HCC) 07/08/2016  . Coronary artery disease 07/08/2016  . Essential hypertension 07/08/2016  . Hyperlipidemia 07/08/2016    Past Surgical History:  Procedure Laterality Date  . ABOVE KNEE LEG AMPUTATION Left   . TRICEPS TENDON REPAIR      Prior to Admission medications   Medication Sig Start Date End Date Taking? Authorizing Provider  ertapenem [facility]) IVPB Inject 1 g into the vein daily. 09/09/17  Yes [provider]  bacitracin ointment Apply to affected area daily 05/09/17 05/09/18  Rebecka Apley, MD  bisacodyl (DULCOLAX) 5 MG EC tablet Take 10 mg by mouth daily as needed for moderate constipation.    [provider]  carvedilol (COREG) 12.5 MG tablet Take 12.5 mg by mouth 2 (two) times daily with a meal.  06/06/16   [provider]  divalproex (DEPAKOTE ER) 250 MG 24 hr tablet Take 250 mg by mouth daily.     [provider]  divalproex (DEPAKOTE SPRINKLE) 125 MG capsule Take 125 mg by mouth daily.    [provider]  senna-docusate (SENOKOT-S) 8.6-50 MG tablet Take 1 tablet by mouth at bedtime as needed.     [provider]    Allergies Penicillin g  History reviewed. No pertinent family history.  Social History Social History   Tobacco Use  . Smoking status: Former Games developer  . Smokeless tobacco: Never Used  Substance Use Topics  . Alcohol use: No  . Drug use: No    Review of Systems  Constitutional: Positive for dehydration and unresponsive state.  No fever/chills. Eyes: No visual changes. ENT: No sore throat. Cardiovascular: Denies chest pain. Respiratory: Denies shortness of breath. Gastrointestinal: No abdominal pain.  No nausea, no vomiting.  No diarrhea.  No constipation. Genitourinary: Negative for dysuria. Musculoskeletal: Negative for back pain. Skin: Positive for wounds being treated on right lower leg.  Negative for rash. Neurological: Negative for headaches, focal weakness or numbness.   ____________________________________________   PHYSICAL EXAM:  VITAL SIGNS: ED Triage Vitals  Enc Vitals Group     BP 09/10/17 0111 115/68     Pulse Rate 09/10/17 0111 73     Resp 09/10/17 0111 (!) 22  Temp 09/10/17 0111 99 F (37.2 C)     Temp Source 09/10/17 0111 Rectal     SpO2 09/10/17 0104 91 %     Weight 09/10/17 0113 115 lb (52.2 kg)     Height 09/10/17 0113 6' (1.829 m)     Head Circumference --      Peak Flow --      Pain Score --      Pain Loc --      Pain Edu? --      Excl. in GC? --     Constitutional: Unresponsive.  Frail appearing and in mild acute distress. Eyes: Conjunctivae are normal. PERRL.  EOMI. Head: Atraumatic. Nose: No congestion/rhinnorhea. Mouth/Throat: Mucous membranes are dry.  Oropharynx non-erythematous. Neck: No stridor.   Cardiovascular: Normal rate, regular rhythm. Grossly normal heart sounds.  Good peripheral circulation. Respiratory: Normal respiratory effort.  No retractions. Lungs mildly diminished bilaterally. Gastrointestinal: Soft and nontender. No distention. No abdominal bruits. No CVA tenderness. Musculoskeletal: Left AKA.  Right lower leg with several fresh dressings.  Reported history of wounds. Neurologic: Unresponsive, contractured. Skin:  Skin is warm, dry and intact. No rash noted. Psychiatric: Unable to assess. ____________________________________________   LABS (all labs ordered are listed, but only abnormal results are displayed)  Labs Reviewed  CBC WITH DIFFERENTIAL/PLATELET - Abnormal; Notable for the following components:      Result Value   RBC 4.39 (*)    Hemoglobin 12.6 (*)    HCT 39.2 (*)    RDW 14.9 (*)    All other components within normal limits  COMPREHENSIVE METABOLIC PANEL - Abnormal; Notable for the following components:   Sodium 149 (*)    Chloride 117 (*)    Glucose, Bld 109 (*)    BUN 70 (*)    Creatinine, Ser 2.68 (*)    Calcium 8.2 (*)    Total Protein 5.9 (*)    Albumin 2.5 (*)    GFR calc non Af Amer 19 (*)    GFR calc Af Amer 22 (*)    All other components within normal limits  TROPONIN I - Abnormal; Notable for the following components:   Troponin I 0.14 (*)    All other components within normal limits  LACTIC ACID, PLASMA - Abnormal; Notable for the following components:   Lactic Acid, Venous 2.0 (*)    All other components within normal limits  CULTURE, BLOOD (ROUTINE X 2)  CULTURE, BLOOD (ROUTINE X 2)  LACTIC ACID, PLASMA  URINALYSIS, COMPLETE (UACMP) WITH MICROSCOPIC  TROPONIN I   ____________________________________________  EKG  ED ECG REPORT I, Omarrion Carmer J, the attending physician,  personally viewed and interpreted this ECG.   Date: 09/10/2017  EKG Time: 0115  Rate: 74  Rhythm: normal EKG, normal sinus rhythm  Axis: LAD  Intervals:none  ST&T Change: Ventricularly paced rhythm  ____________________________________________  RADIOLOGY  Ct Head Wo Contrast  Result Date: 09/10/2017 CLINICAL DATA:  Acute onset of altered mental status. EXAM: CT HEAD WITHOUT CONTRAST TECHNIQUE: Contiguous axial images were obtained from the base of the skull through the vertex without intravenous contrast. COMPARISON:  CT of the head performed 05/08/2017 FINDINGS: Brain: There is an acute or subacute evolving infarct at the left occipital lobe, new from the prior study. There is no evidence of hemorrhagic transformation. No mass lesion is seen. There also appears to be a subacute right frontal subdural hematoma, new from the prior study, measuring 9 mm in thickness. Prominence of the ventricles and sulci  reflects moderately severe cortical volume loss. Diffuse periventricular and subcortical white matter change likely reflects small vessel ischemic microangiopathy. Cerebellar atrophy is noted, with underlying chronic cerebellar infarcts. The brainstem and fourth ventricle are within normal limits. The basal ganglia are unremarkable in appearance. No midline shift is seen. Vascular: No hyperdense vessel or unexpected calcification. Skull: There is no evidence of fracture; visualized osseous structures are unremarkable in appearance. Sinuses/Orbits: The visualized portions of the orbits are within normal limits. The paranasal sinuses and mastoid air cells are well-aerated. Other: No significant soft tissue abnormalities are seen. IMPRESSION: 1. Acute or subacute evolving infarct at the left occipital lobe, new from the prior study. No evidence of hemorrhagic transformation. 2. Subacute right frontal subdural hematoma, new from the prior study, measuring 9 mm in thickness. 3. Moderately severe cortical  volume loss and diffuse small vessel ischemic microangiopathy. 4. Chronic cerebellar infarcts noted, with associated encephalomalacia. Critical Value/emergent results were called by telephone at the time of interpretation on 09/10/2017 at 2:48 am to Dr. Chiquita LothJADE Buryl Bamber, who verbally acknowledged these results. Electronically Signed   By: Roanna RaiderJeffery  Chang M.D.   On: 09/10/2017 02:50   Dg Chest Port 1 View  Result Date: 09/10/2017 CLINICAL DATA:  Acute onset of altered mental status. EXAM: PORTABLE CHEST 1 VIEW COMPARISON:  Chest radiograph performed 12/22/2014 FINDINGS: The lungs are mildly hypoexpanded. Haziness at the right lung may reflect atelectasis or possibly mild infection, depending on the patient's symptoms. There is no evidence of pleural effusion or pneumothorax. The cardiomediastinal silhouette is normal in size. The patient is status post median sternotomy, with several chronically fractured superior sternal wires. A pacemaker is noted overlying the left chest wall, with leads ending overlying the right atrium and right ventricle. No acute osseous abnormalities are seen. IMPRESSION: Lungs mildly hypoexpanded. Haziness at the right lung may reflect atelectasis or possibly mild infection, depending on the patient's symptoms. Electronically Signed   By: Roanna RaiderJeffery  Chang M.D.   On: 09/10/2017 01:51    ____________________________________________   PROCEDURES  Procedure(s) performed: None  Procedures  Critical Care performed: No  ____________________________________________   INITIAL IMPRESSION / ASSESSMENT AND PLAN / ED COURSE  As part of my medical decision making, I reviewed the following data within the electronic MEDICAL RECORD NUMBER Nursing notes reviewed and incorporated, Labs reviewed, EKG interpreted, Old chart reviewed, Radiograph reviewed, Discussed with admitting physician and Notes from prior ED visits.   83 year old male sent from nursing facility for evaluation of altered  mentation.  Differential diagnosis includes but is not limited to CVA, ICH, encephalopathy, CAD, dehydration, renal failure, infection, sepsis, etc.  Laboratory and imaging results remarkable for acute renal insult, elevated troponin, elevated lactate, suggestion of pneumonia on chest x-ray.  ED code sepsis initiated.  Antibiotics administered for HCAP.  Discussed with hospitalist Dr. Tobi BastosPyreddy who will evaluate patient in the emergency department for admission.  Clinical Course as of Sep 10 314  Sat Sep 10, 2017  0254 Spoke with Dr. Cherly Hensenhang from radiology regarding results of CT head.  Attempted to contact patient's daughter Cammy CopaHaydee Roman [phone]; left message to call back urgently.  [JS]  0310 Patient's daughter called back and we discussed patient's acute medical issues including sepsis, subacute CVA and subacute subdural hematoma.  We did discuss risk/benefits of transferring patient to tertiary care center with neurosurgical coverage.  Daughter is patient's legal guardian as well as his healthcare power of attorney.  She does not wish for her father to be transferred given his  age and comorbidities.  At this time she desires to make him DNR.  This was also verified via telephone by the nurse, and I have placed this order in the records.  Daughter understands the serious nature of patient's illness and grim prognosis.  [JS]    Clinical Course User Index [JS] Irean Hong, MD     ____________________________________________   FINAL CLINICAL IMPRESSION(S) / ED DIAGNOSES  Final diagnoses:  Altered mental status, unspecified altered mental status type  Dehydration  AKI (acute kidney injury) (HCC)  Elevated troponin  Cerebrovascular accident (CVA), unspecified mechanism (HCC)  SDH (subdural hematoma) [facility])     ED Discharge Orders    None       Note:  This document was prepared using Dragon voice recognition software and may include unintentional dictation errors.    Irean Hong, MD 09/10/17 [phone]

## 2017-09-10 NOTE — Consult Note (Signed)
Reason for Consult:lethargy confusion  Referring Physician: Dr. Tobi BastosPyreddy   CC: stroke and SDH   HPI: Jack French is an 83 y.o. male male with a known history of chronic kidney disease, diabetes mellitus type 2, dementia, peripheral vascular disease, hypertension was referred from the nursing home for confusion and decreased responsiveness. Patient was worked up with CT head which showed subacute left occipital lobe ischemia as well as subacute SDH R frontal lobe.   no hemorrhagic transformation.CThead also showed subacute right frontal subdural hematoma 9mm    Past Medical History:  Diagnosis Date  . Chronic kidney disease   . Dementia   . Diabetes mellitus without complication (HCC)   . Hypertension   . Peripheral vascular disease [facility](HCC)     Past Surgical History:  Procedure Laterality Date  . ABOVE KNEE LEG AMPUTATION Left   . TRICEPS TENDON REPAIR      History reviewed. No pertinent family history.  Social History:  reports that he has quit smoking. he has never used smokeless tobacco. He reports that he does not drink alcohol or use drugs.  Allergies  Allergen Reactions  . Penicillin G Rash    Medications: I have reviewed the patient's current medications.  ROS: Unable to obtain as not following commands   Physical Examination: Blood pressure (!) 113/48, pulse 73, temperature 97.6 F (36.4 C), temperature source Oral, resp. rate 16, height 6' (1.829 m), weight 115 lb (52.2 kg), SpO2 (!) 87 %.  Pt is very lethargic Not following commands  EOM intact Blinks to threat.   Laboratory Studies:   Basic Metabolic Panel: Recent Labs  Lab 09/10/17 0135 09/10/17 0545  NA 149*  --   K 4.1  --   CL 117*  --   CO2 23  --   GLUCOSE 109*  --   BUN 70*  --   CREATININE 2.68*  --   CALCIUM 8.2*  --   MG  --  2.2    Liver Function Tests: Recent Labs  Lab 09/10/17 0135  AST 34  ALT 22  ALKPHOS 63  BILITOT 0.9  PROT 5.9*  ALBUMIN 2.5*   No results for  input(s): LIPASE, AMYLASE in the last 168 hours. No results for input(s): AMMONIA in the last 168 hours.  CBC: Recent Labs  Lab 09/10/17 0135  WBC 8.1  NEUTROABS 6.5  HGB 12.6*  HCT 39.2*  MCV 89.4  PLT 209    Cardiac Enzymes: Recent Labs  Lab 09/10/17 0135 09/10/17 0545  TROPONINI 0.14* 0.13*    BNP: Invalid input(s): POCBNP  CBG: Recent Labs  Lab 09/10/17 0741  GLUCAP 117*    Microbiology: Results for orders placed or performed during the hospital encounter of 09/10/17  Culture, blood (routine x 2)     Status: None (Preliminary result)   Collection Time: 09/10/17  3:07 AM  Result Value Ref Range Status   Specimen Description BLOOD RIGHT ANTECUBITAL  Final   Special Requests   Final    BOTTLES DRAWN AEROBIC AND ANAEROBIC Blood Culture adequate volume   Culture   Final    NO GROWTH < 12 HOURS Performed at [facility], [address] [place], [place] [address]    Report Status PENDING  Incomplete  Culture, blood (routine x 2)     Status: None (Preliminary result)   Collection Time: 09/10/17  3:07 AM  Result Value Ref Range Status   Specimen Description BLOOD LEFT ANTECUBITAL  Final   Special Requests  Final    BOTTLES DRAWN AEROBIC AND ANAEROBIC Blood Culture adequate volume   Culture   Final    NO GROWTH < 12 HOURS Performed at [facility], [address] [place], [place] [address]    Report Status PENDING  Incomplete  MRSA PCR Screening     Status: None   Collection Time: 09/10/17  5:02 AM  Result Value Ref Range Status   MRSA by PCR NEGATIVE NEGATIVE Final    Comment:        The GeneXpert MRSA Assay (FDA approved for NASAL specimens only), is one component of a comprehensive MRSA colonization surveillance program. It is not intended to diagnose MRSA infection nor to guide or monitor treatment for MRSA infections. Performed at [facility], [address] [place], [place] [address]     Coagulation  Studies: No results for input(s): LABPROT, INR in the last 72 hours.  Urinalysis: No results for input(s): COLORURINE, LABSPEC, PHURINE, GLUCOSEU, HGBUR, BILIRUBINUR, KETONESUR, PROTEINUR, UROBILINOGEN, NITRITE, LEUKOCYTESUR in the last 168 hours.  Invalid input(s): APPERANCEUR  Lipid Panel:     Component Value Date/Time   CHOL 157 09/10/2017 0135   TRIG 103 09/10/2017 0135   HDL 25 (L) 09/10/2017 0135   CHOLHDL 6.3 09/10/2017 0135   VLDL 21 09/10/2017 0135   LDLCALC 111 (H) 09/10/2017 0135    HgbA1C: No results found for: HGBA1C  Urine Drug Screen:  No results found for: LABOPIA, COCAINSCRNUR, LABBENZ, AMPHETMU, THCU, LABBARB  Alcohol Level: No results for input(s): ETH in the last 168 hours.    Imaging: Ct Head Wo Contrast  Result Date: 09/10/2017 CLINICAL DATA:  Acute onset of altered mental status. EXAM: CT HEAD WITHOUT CONTRAST TECHNIQUE: Contiguous axial images were obtained from the base of the skull through the vertex without intravenous contrast. COMPARISON:  CT of the head performed 05/08/2017 FINDINGS: Brain: There is an acute or subacute evolving infarct at the left occipital lobe, new from the prior study. There is no evidence of hemorrhagic transformation. No mass lesion is seen. There also appears to be a subacute right frontal subdural hematoma, new from the prior study, measuring 9 mm in thickness. Prominence of the ventricles and sulci reflects moderately severe cortical volume loss. Diffuse periventricular and subcortical white matter change likely reflects small vessel ischemic microangiopathy. Cerebellar atrophy is noted, with underlying chronic cerebellar infarcts. The brainstem and fourth ventricle are within normal limits. The basal ganglia are unremarkable in appearance. No midline shift is seen. Vascular: No hyperdense vessel or unexpected calcification. Skull: There is no evidence of fracture; visualized osseous structures are unremarkable in appearance.  Sinuses/Orbits: The visualized portions of the orbits are within normal limits. The paranasal sinuses and mastoid air cells are well-aerated. Other: No significant soft tissue abnormalities are seen. IMPRESSION: 1. Acute or subacute evolving infarct at the left occipital lobe, new from the prior study. No evidence of hemorrhagic transformation. 2. Subacute right frontal subdural hematoma, new from the prior study, measuring 9 mm in thickness. 3. Moderately severe cortical volume loss and diffuse small vessel ischemic microangiopathy. 4. Chronic cerebellar infarcts noted, with associated encephalomalacia. Critical Value/emergent results were called by telephone at the time of interpretation on 09/10/2017 at 2:48 am to Dr. Chiquita LothJADE SUNG, who verbally acknowledged these results. Electronically Signed   By: Roanna RaiderJeffery  Chang M.D.   On: 09/10/2017 02:50   Dg Chest Port 1 View  Result Date: 09/10/2017 CLINICAL DATA:  Acute onset of altered mental status. EXAM: PORTABLE CHEST 1  VIEW COMPARISON:  Chest radiograph performed 12/22/2014 FINDINGS: The lungs are mildly hypoexpanded. Haziness at the right lung may reflect atelectasis or possibly mild infection, depending on the patient's symptoms. There is no evidence of pleural effusion or pneumothorax. The cardiomediastinal silhouette is normal in size. The patient is status post median sternotomy, with several chronically fractured superior sternal wires. A pacemaker is noted overlying the left chest wall, with leads ending overlying the right atrium and right ventricle. No acute osseous abnormalities are seen. IMPRESSION: Lungs mildly hypoexpanded. Haziness at the right lung may reflect atelectasis or possibly mild infection, depending on the patient's symptoms. Electronically Signed   By: Roanna Raider M.D.   On: 09/10/2017 01:51     Assessment/Plan:  83 y.o. male male with a known history of chronic kidney disease, diabetes mellitus type 2, dementia, peripheral  vascular disease, hypertension was referred from the nursing home for confusion and decreased responsiveness. Patient was worked up with CT head which showed subacute left occipital lobe ischemia as well as subacute SDH R frontal lobe.   no hemorrhagic transformation.CThead also showed subacute right frontal subdural hematoma 9mm  - Pt is lethargic likely multifactorial in setting of dehydration/sepsis/metabolic encephalopathy as well as intracranial pathology - SDH with no midline shift.  Family as I understand don't want aggressive care, currently not available.  - He would not be surgical candidate at this point  - DNR/DNI - con't hydration and antibiotics as per primary team - family unavailable at this point but would discuss palliative measures - No need for MRI  Pauletta Browns  09/10/2017, 10:24 AM

## 2017-09-10 NOTE — Clinical Social Work Note (Signed)
CSW aware via chart review that this patient admitted from [facility]. CSW is following pending goals of care meeting and palliative consult.   Argentina PonderKaren Martha Elynn Patteson, MSW, Theresia MajorsLCSWA [phone]

## 2017-09-10 NOTE — ED Notes (Signed)
Performed in and out catheter. No urine obtained, puss present. Continue with bladder scan.AS

## 2017-09-10 NOTE — Progress Notes (Addendum)
[facility] [place] [facility]   PATIENT NAME: Jack French    MR#:  [id number]  DATE OF BIRTH:  07/30/1924  SUBJECTIVE:   Patient here due to altered mental status. Patient's son was a poor historian given his advanced dementia and being nonverbal. Patient on CT head was noted to have a subacute CVA in the occipital lobe and also noted to have a subacute right frontal subdural hematoma. Patient's family does not want to pursue aggressive care or any neurosurgery.  REVIEW OF SYSTEMS:    Review of Systems  Unable to perform ROS: Mental acuity    Nutrition: NPO Tolerating Diet: No Tolerating PT: Await Eval.   DRUG ALLERGIES:   Allergies  Allergen Reactions  . Penicillin G Rash    VITALS:  Blood pressure (!) 103/56, pulse 63, temperature 98.4 F (36.9 C), temperature source Oral, resp. rate 16, height 6' (1.829 m), weight 52.2 kg (115 lb), SpO2 98 %.  PHYSICAL EXAMINATION:   Physical Exam  GENERAL:  83 y.o.-year-old patient lying in bed lethargic, Encephalopathic in NAD.  EYES: Pupils equal, round, reactive to light. No scleral icterus. Extraocular muscles intact.  HEENT: Head atraumatic, normocephalic. Oropharynx and nasopharynx clear.  NECK:  Supple, no jugular venous distention. No thyroid enlargement, no tenderness.  LUNGS: Poor Resp. effort, no wheezing, rales, rhonchi. No use of accessory muscles of respiration.  CARDIOVASCULAR: S1, S2 normal. No murmurs, rubs, or gallops.  ABDOMEN: Soft, nontender, nondistended. Bowel sounds present. No organomegaly or mass.  EXTREMITIES: No cyanosis, clubbing or edema b/l.   Left AKA NEUROLOGIC: Cranial nerves II through XII are intact. No focal Motor or sensory deficits b/l. Globally weak.   PSYCHIATRIC: The patient is alert and oriented x 1.  SKIN: No obvious rash, lesion, or ulcer.    LABORATORY PANEL:   CBC Recent Labs  Lab 09/10/17 0135  WBC 8.1  HGB 12.6*  HCT 39.2*  PLT 209    ------------------------------------------------------------------------------------------------------------------  Chemistries  Recent Labs  Lab 09/10/17 0135 09/10/17 0545  NA 149*  --   K 4.1  --   CL 117*  --   CO2 23  --   GLUCOSE 109*  --   BUN 70*  --   CREATININE 2.68*  --   CALCIUM 8.2*  --   MG  --  2.2  AST 34  --   ALT 22  --   ALKPHOS 63  --   BILITOT 0.9  --    ------------------------------------------------------------------------------------------------------------------  Cardiac Enzymes Recent Labs  Lab 09/10/17 1118  TROPONINI 0.09*   ------------------------------------------------------------------------------------------------------------------  RADIOLOGY:  Ct Head Wo Contrast  Result Date: 09/10/2017 CLINICAL DATA:  Acute onset of altered mental status. EXAM: CT HEAD WITHOUT CONTRAST TECHNIQUE: Contiguous axial images were obtained from the base of the skull through the vertex without intravenous contrast. COMPARISON:  CT of the head performed 05/08/2017 FINDINGS: Brain: There is an acute or subacute evolving infarct at the left occipital lobe, new from the prior study. There is no evidence of hemorrhagic transformation. No mass lesion is seen. There also appears to be a subacute right frontal subdural hematoma, new from the prior study, measuring 9 mm in thickness. Prominence of the ventricles and sulci reflects moderately severe cortical volume loss. Diffuse periventricular and subcortical white matter change likely reflects small vessel ischemic microangiopathy. Cerebellar atrophy is noted, with underlying chronic cerebellar infarcts. The brainstem and fourth ventricle are within normal limits. The basal ganglia are unremarkable in appearance. No  midline shift is seen. Vascular: No hyperdense vessel or unexpected calcification. Skull: There is no evidence of fracture; visualized osseous structures are unremarkable in appearance. Sinuses/Orbits: The  visualized portions of the orbits are within normal limits. The paranasal sinuses and mastoid air cells are well-aerated. Other: No significant soft tissue abnormalities are seen. IMPRESSION: 1. Acute or subacute evolving infarct at the left occipital lobe, new from the prior study. No evidence of hemorrhagic transformation. 2. Subacute right frontal subdural hematoma, new from the prior study, measuring 9 mm in thickness. 3. Moderately severe cortical volume loss and diffuse small vessel ischemic microangiopathy. 4. Chronic cerebellar infarcts noted, with associated encephalomalacia. Critical Value/emergent results were called by telephone at the time of interpretation on 09/10/2017 at 2:48 am to Dr. Chiquita LothJADE SUNG, who verbally acknowledged these results. Electronically Signed   By: Roanna RaiderJeffery  Chang M.D.   On: 09/10/2017 02:50   Dg Chest Port 1 View  Result Date: 09/10/2017 CLINICAL DATA:  Acute onset of altered mental status. EXAM: PORTABLE CHEST 1 VIEW COMPARISON:  Chest radiograph performed 12/22/2014 FINDINGS: The lungs are mildly hypoexpanded. Haziness at the right lung may reflect atelectasis or possibly mild infection, depending on the patient's symptoms. There is no evidence of pleural effusion or pneumothorax. The cardiomediastinal silhouette is normal in size. The patient is status post median sternotomy, with several chronically fractured superior sternal wires. A pacemaker is noted overlying the left chest wall, with leads ending overlying the right atrium and right ventricle. No acute osseous abnormalities are seen. IMPRESSION: Lungs mildly hypoexpanded. Haziness at the right lung may reflect atelectasis or possibly mild infection, depending on the patient's symptoms. Electronically Signed   By: Roanna RaiderJeffery  Chang M.D.   On: 09/10/2017 01:51     ASSESSMENT AND PLAN:   83 year old male with past medical history of advanced dementia, hypertension, diabetes, peripheral vascular disease status post left  AKA, chronic kidney disease stage IV who presents to the hospital due to altered mental status.  1. Altered mental status-secondary to underlying dementia combined with suspected CVA and also a subacute subdural hematoma. -Patient's CT head on admission showed a subacute occipital CVA and also a right frontal subacute subdural hematoma.  -patient's family does not want to pursue neurosurgical intervention for the subdural hematoma. We'll continue to follow mental status, -Continue aspirin suppository, cannot get MRI as pt. Has a pacemaker, await carotid duplex for finishing stroke workup. Await neurological evaluation.  2. Right-sided subdural hematoma-this was noted incidentally on the CT scan of head on admission. Patient's family does not want to pursue aggressive care or any neurosurgical intervention.  3 diabetes type 2 without complication-continue signs: Stable. Follow blood sugars.  4. Aspiration pneumonia-continue meropenem, follow cultures. Currently afebrile and hemodynamically stable.  5. Dementia with behavioral disturbance-continue Depakote.  6. CKD Stage IV - Cr. At baseline and will cont. To monitor.    All the records are reviewed and case discussed with Care Management/Social Worker. Management plans discussed with the patient, family and they are in agreement.  CODE STATUS: DO NOT RESUSCITATE  DVT Prophylaxis: Teds and SCDs  TOTAL TIME TAKING CARE OF THIS PATIENT: 30 minutes.   POSSIBLE D/C IN 2-3 DAYS, DEPENDING ON CLINICAL CONDITION.   Houston SirenSAINANI,Ameera Tigue J M.D on 09/10/2017 at 12:12 PM  Between 7am to 6pm - Pager - [phone]  After 6pm go to [email or web address] - Social research officer, governmentpassword [email or web address]  [facility]  Office  [phone]  CC: Primary care physician; Dimple CaseySmith, Sean A, MD

## 2017-09-10 NOTE — Plan of Care (Signed)
Pt remains unresponsive throughout the shift; MD aware; pt turned; remains in Low Bed; room near the nurse's station

## 2017-09-10 NOTE — Progress Notes (Signed)
SLP Cancellation Note  Patient Details Name: Andres EgeBolivar G Watford MRN: [id number] DOB: 03/22/1924   Cancelled treatment:       Reason Eval/Treat Not Completed: Medical issues which prohibited therapy;Patient's level of consciousness;Patient not medically ready(chart reviewed; consulted NSG who stated to Hold on eval). ST services can be reconsulted if status improves for safe participation in BSE.   Jerilynn SomKatherine Godfrey Tritschler, MS, CCC-SLP [place]ap 09/10/2017, 11:20 AM

## 2017-09-10 NOTE — H&P (Addendum)
 [facility] [place] at [facility]   PATIENT NAME: Jack GooBolivar Jeppsen    MR#:  [id number]  DATE OF BIRTH:  03/16/1924  DATE OF ADMISSION:  09/10/2017  PRIMARY CARE PHYSICIAN: Dimple CaseySmith, Sean A, MD   REQUESTING/REFERRING PHYSICIAN:   CHIEF COMPLAINT:   Chief Complaint  Patient presents with  . Altered Mental Status    HISTORY OF PRESENT ILLNESS: Jack French  is a 83 y.o. male with a known history of chronic kidney disease, diabetes mellitus type 2, dementia, peripheral vascular disease, hypertension was referred from the nursing home for confusion and decreased responsiveness. Patient unable to give much history because he is confused.  He was worked up in the emergency room his lactic acid level was elevated. Blood pressure was on the lower side at initial presentation.  Patient was worked up with CT head which showed acute infarct in the left occipital lobe, no hemorrhagic transformation.CThead also showed subacute right frontal subdural hematoma 9 mm in thickness.Case was discussed by ER physician with patient's daughter [facility]([name] Roman) who do not want any aggressive intervention such as neurosurgery for the subdural hematoma and transfer to tertiary center.  Patient's daughter does not want any aggressive intervention for the hematoma.Patient's daughter expressed her wishes that patient be made DNR.They do not want any cardiac resuscitation and intubation and mechanical ventilation if the need arises.Patient is DNR by CODE STATUS. Patient started on IV fluids based on sepsis protocol and on broad-spectrum IV antibiotics.  PAST MEDICAL HISTORY:   Past Medical History:  Diagnosis Date  . Chronic kidney disease   . Dementia   . Diabetes mellitus without complication (HCC)   . Hypertension   . Peripheral vascular disease (HCC)     PAST SURGICAL HISTORY:  Past Surgical History:  Procedure Laterality Date  . ABOVE KNEE LEG AMPUTATION Left   . TRICEPS TENDON  REPAIR      SOCIAL HISTORY:  Social History   Tobacco Use  . Smoking status: Former Games developermoker  . Smokeless tobacco: Never Used  Substance Use Topics  . Alcohol use: No    FAMILY HISTORY: History reviewed. No pertinent family history.  DRUG ALLERGIES:  Allergies  Allergen Reactions  . Penicillin G Rash    REVIEW OF SYSTEMS:   Could not be obtained as patient is confused and demented  MEDICATIONS AT HOME:  Prior to Admission medications   Medication Sig Start Date End Date Taking? Authorizing Provider  ertapenem [facility](INVANZ) IVPB Inject 1 g into the vein daily. 09/09/17  Yes [provider]  bacitracin ointment Apply to affected area daily 05/09/17 05/09/18  Rebecka ApleyWebster, Allison P, MD  bisacodyl (DULCOLAX) 5 MG EC tablet Take 10 mg by mouth daily as needed for moderate constipation.    [provider]  carvedilol (COREG) 12.5 MG tablet Take 12.5 mg by mouth 2 (two) times daily with a meal.  06/06/16   [provider]  divalproex (DEPAKOTE ER) 250 MG 24 hr tablet Take 250 mg by mouth daily.     [provider]  divalproex (DEPAKOTE SPRINKLE) 125 MG capsule Take 125 mg by mouth daily.    [provider]  senna-docusate (SENOKOT-S) 8.6-50 MG tablet Take 1 tablet by mouth at bedtime as needed.     [provider]      PHYSICAL EXAMINATION:   VITAL SIGNS: Blood pressure 99/68, pulse 65, temperature 99 F (37.2 C), temperature source Rectal, resp. rate 13, height 6' (1.829 m), weight 52.2 kg (115  lb), SpO2 96 %.  GENERAL:  83 y.o.-year-old patient lying in the bed not oriented to time, place and person EYES: Pupils equal, round decreased response to light and accommodation. No scleral icterus. Extraocular muscles intact.  HEENT: Head atraumatic, normocephalic. Oropharynx dry and nasopharynx clear.  NECK:  Supple, no jugular venous distention. No thyroid enlargement, no tenderness.  LUNGS: Normal breath sounds bilaterally, no  wheezing, rales,rhonchi or crepitation. No use of accessory muscles of respiration.  CARDIOVASCULAR: S1, S2 normal. No murmurs, rubs, or gallops.  ABDOMEN: Soft, nontender, nondistended. Bowel sounds present. No organomegaly or mass.  EXTREMITIES: Has left above knee amputation Right lower extremity wounds noted  NEUROLOGIC : Not oriented to time, place and person Moves extremities Gait not checked.  PSYCHIATRIC: could not be assessed SKIN: has lower extremity ulcers   LABORATORY PANEL:   CBC Recent Labs  Lab 09/10/17 0135  WBC 8.1  HGB 12.6*  HCT 39.2*  PLT 209  MCV 89.4  MCH 28.6  MCHC 32.0  RDW 14.9*  LYMPHSABS 1.1  MONOABS 0.5  EOSABS 0.0  BASOSABS 0.0   ------------------------------------------------------------------------------------------------------------------  Chemistries  Recent Labs  Lab 09/10/17 0135  NA 149*  K 4.1  CL 117*  CO2 23  GLUCOSE 109*  BUN 70*  CREATININE 2.68*  CALCIUM 8.2*  AST 34  ALT 22  ALKPHOS 63  BILITOT 0.9   ------------------------------------------------------------------------------------------------------------------ estimated creatinine clearance is 13.3 mL/min (A) (by C-G formula based on SCr of 2.68 mg/dL (H)). ------------------------------------------------------------------------------------------------------------------ No results for input(s): TSH, T4TOTAL, T3FREE, THYROIDAB in the last 72 hours.  Invalid input(s): FREET3   Coagulation profile No results for input(s): INR, PROTIME in the last 168 hours. ------------------------------------------------------------------------------------------------------------------- No results for input(s): DDIMER in the last 72 hours. -------------------------------------------------------------------------------------------------------------------  Cardiac Enzymes Recent Labs  Lab 09/10/17 0135  TROPONINI 0.14*    ------------------------------------------------------------------------------------------------------------------ Invalid input(s): POCBNP  ---------------------------------------------------------------------------------------------------------------  Urinalysis    Component Value Date/Time   COLORURINE Yellow 12/23/2014 0057   APPEARANCEUR Cloudy 12/23/2014 0057   LABSPEC 1.017 12/23/2014 0057   PHURINE 5.0 12/23/2014 0057   GLUCOSEU Negative 12/23/2014 0057   HGBUR 1+ 12/23/2014 0057   BILIRUBINUR Negative 12/23/2014 0057   KETONESUR Negative 12/23/2014 0057   PROTEINUR 30 mg/dL 16/10/960404/12/2014 54090057   NITRITE Negative 12/23/2014 0057   LEUKOCYTESUR Negative 12/23/2014 0057     RADIOLOGY: Ct Head Wo Contrast  Result Date: 09/10/2017 CLINICAL DATA:  Acute onset of altered mental status. EXAM: CT HEAD WITHOUT CONTRAST TECHNIQUE: Contiguous axial images were obtained from the base of the skull through the vertex without intravenous contrast. COMPARISON:  CT of the head performed 05/08/2017 FINDINGS: Brain: There is an acute or subacute evolving infarct at the left occipital lobe, new from the prior study. There is no evidence of hemorrhagic transformation. No mass lesion is seen. There also appears to be a subacute right frontal subdural hematoma, new from the prior study, measuring 9 mm in thickness. Prominence of the ventricles and sulci reflects moderately severe cortical volume loss. Diffuse periventricular and subcortical white matter change likely reflects small vessel ischemic microangiopathy. Cerebellar atrophy is noted, with underlying chronic cerebellar infarcts. The brainstem and fourth ventricle are within normal limits. The basal ganglia are unremarkable in appearance. No midline shift is seen. Vascular: No hyperdense vessel or unexpected calcification. Skull: There is no evidence of fracture; visualized osseous structures are unremarkable in appearance. Sinuses/Orbits: The  visualized portions of the orbits are within normal limits. The paranasal sinuses and mastoid air cells are well-aerated.  Other: No significant soft tissue abnormalities are seen. IMPRESSION: 1. Acute or subacute evolving infarct at the left occipital lobe, new from the prior study. No evidence of hemorrhagic transformation. 2. Subacute right frontal subdural hematoma, new from the prior study, measuring 9 mm in thickness. 3. Moderately severe cortical volume loss and diffuse small vessel ischemic microangiopathy. 4. Chronic cerebellar infarcts noted, with associated encephalomalacia. Critical Value/emergent results were called by telephone at the time of interpretation on 09/10/2017 at 2:48 am to Dr. Chiquita Loth, who verbally acknowledged these results. Electronically Signed   By: Roanna Raider M.D.   On: 09/10/2017 02:50   Dg Chest Port 1 View  Result Date: 09/10/2017 CLINICAL DATA:  Acute onset of altered mental status. EXAM: PORTABLE CHEST 1 VIEW COMPARISON:  Chest radiograph performed 12/22/2014 FINDINGS: The lungs are mildly hypoexpanded. Haziness at the right lung may reflect atelectasis or possibly mild infection, depending on the patient's symptoms. There is no evidence of pleural effusion or pneumothorax. The cardiomediastinal silhouette is normal in size. The patient is status post median sternotomy, with several chronically fractured superior sternal wires. A pacemaker is noted overlying the left chest wall, with leads ending overlying the right atrium and right ventricle. No acute osseous abnormalities are seen. IMPRESSION: Lungs mildly hypoexpanded. Haziness at the right lung may reflect atelectasis or possibly mild infection, depending on the patient's symptoms. Electronically Signed   By: Roanna Raider M.D.   On: 09/10/2017 01:51    EKG: Orders placed or performed during the hospital encounter of 09/10/17  . ED EKG  . ED EKG  . EKG 12-Lead  . EKG 12-Lead    IMPRESSION AND  PLAN: 83 year old male patient with history of chronic kidney disease, dementia, diabetes mellitus, peripheral vascular disease, hypertension presented to the emergency room from the nursing home for confusion and change in mental status.  Admitting diagnosis 1.  Acute occipital CVA 2.  Subdural hematoma 3.  Altered mental status 4.  Sepsis 5.  Healthcare associated pneumonia 6.  Dehydration 7.  Acute on chronic renal failure 8.  Hypernatremia 9.  Abnormal troponin secondary to demand ischemia Treatment plan Admit patient to medical floor Neurology consultation N.p.o. for now Swallow study Patient's family do not want any aggressive intervention such as neurosurgery for subdural hematoma Patient is DNR by CODE STATUS We will check carotid ultrasound, MRI brain and MRA brain IV vancomycin and IV meropenem antibiotics Follow-up cultures Wound care consultation High risk of morbidity and mortality Overall prognosis appears poor  All the records are reviewed and case discussed with ED provider. Management plans discussed with the patient, family and they are in agreement.  CODE STATUS:DNR    Code Status Orders  (From admission, onward)        Start     Ordered   09/10/17 0308  Do not attempt resuscitation/DNR  Continuous    Question Answer Comment  In the event of cardiac or respiratory ARREST Do not call a "code blue"   In the event of cardiac or respiratory ARREST Do not perform Intubation, CPR, defibrillation or ACLS   In the event of cardiac or respiratory ARREST Use medication by any route, position, wound care, and other measures to relive pain and suffering. May use oxygen, suction and manual treatment of airway obstruction as needed for comfort.   Comments Per patient's daughter [facility]; verified by Chip Boer      09/10/17 0308    Code Status History    Date  Active Date Inactive Code Status Order ID Comments User Context   This patient has a  current code status but no historical code status.       TOTAL TIME TAKING CARE OF THIS PATIENT: 60 minutes.    Ihor Austin M.D on 09/10/2017 at 3:11 AM  Between 7am to 6pm - Pager - [phone]  After 6pm go to [email or web address] - password [email or web address] [facility]  [place] [facility]  Office  [phone]  CC: Primary care physician; Dimple Casey, MD

## 2017-09-10 NOTE — ED Triage Notes (Signed)
Pt presents via EMS from [facility]. EMS reported that staff at nursing home could not get a functional IV started to give the pt fluids per orders. When EMS offered to start an IV for them, the staff said the pt had multiple other problems, including AMS and non-healing wound on R shin and R calf and R heel. Pt has drsg on each wound that was new in 09/09/17. Pt is non-responsive to verbal stimuli and unable to answer questions.

## 2017-09-11 LAB — GLUCOSE, CAPILLARY
GLUCOSE-CAPILLARY: 90 mg/dL (ref 65–99)
GLUCOSE-CAPILLARY: 99 mg/dL (ref 65–99)
GLUCOSE-CAPILLARY: 150 mg/dL — AB (ref 65–99)
Glucose-Capillary: 114 mg/dL — ABNORMAL HIGH (ref 65–99)

## 2017-09-11 LAB — CBC
HCT: 33.8 % — ABNORMAL LOW (ref 40.0–52.0)
HEMOGLOBIN: 10.8 g/dL — AB (ref 13.0–18.0)
MCH: 28.6 pg (ref 26.0–34.0)
MCHC: 32 g/dL (ref 32.0–36.0)
MCV: 89.4 fL (ref 80.0–100.0)
Platelets: 165 10*3/uL (ref 150–440)
RBC: 3.78 MIL/uL — AB (ref 4.40–5.90)
RDW: 15.2 % — ABNORMAL HIGH (ref 11.5–14.5)
WBC: 8 10*3/uL (ref 3.8–10.6)

## 2017-09-11 LAB — BLOOD CULTURE ID PANEL (REFLEXED)
Acinetobacter baumannii: NOT DETECTED
CANDIDA ALBICANS: NOT DETECTED
CANDIDA GLABRATA: NOT DETECTED
CANDIDA KRUSEI: NOT DETECTED
Candida parapsilosis: NOT DETECTED
Candida tropicalis: NOT DETECTED
ENTEROBACTER CLOACAE COMPLEX: NOT DETECTED
ENTEROBACTERIACEAE SPECIES: NOT DETECTED
ENTEROCOCCUS SPECIES: NOT DETECTED
ESCHERICHIA COLI: NOT DETECTED
Haemophilus influenzae: NOT DETECTED
KLEBSIELLA PNEUMONIAE: NOT DETECTED
Klebsiella oxytoca: NOT DETECTED
LISTERIA MONOCYTOGENES: NOT DETECTED
Methicillin resistance: NOT DETECTED
Neisseria meningitidis: NOT DETECTED
PROTEUS SPECIES: NOT DETECTED
Pseudomonas aeruginosa: NOT DETECTED
STAPHYLOCOCCUS SPECIES: DETECTED — AB
STREPTOCOCCUS AGALACTIAE: NOT DETECTED
STREPTOCOCCUS PYOGENES: NOT DETECTED
STREPTOCOCCUS SPECIES: NOT DETECTED
Serratia marcescens: NOT DETECTED
Staphylococcus aureus (BCID): NOT DETECTED
Streptococcus pneumoniae: NOT DETECTED

## 2017-09-11 LAB — BASIC METABOLIC PANEL
ANION GAP: 8 (ref 5–15)
BUN: 55 mg/dL — ABNORMAL HIGH (ref 6–20)
CHLORIDE: 125 mmol/L — AB (ref 101–111)
CO2: 19 mmol/L — AB (ref 22–32)
Calcium: 7.6 mg/dL — ABNORMAL LOW (ref 8.9–10.3)
Creatinine, Ser: 1.75 mg/dL — ABNORMAL HIGH (ref 0.61–1.24)
GFR calc non Af Amer: 32 mL/min — ABNORMAL LOW (ref >60)
GFR, EST AFRICAN AMERICAN: 37 mL/min — AB (ref >60)
Glucose, Bld: 104 mg/dL — ABNORMAL HIGH (ref 65–99)
Potassium: 3.6 mmol/L (ref 3.5–5.1)
SODIUM: 152 mmol/L — AB (ref 135–145)

## 2017-09-11 LAB — URINALYSIS, COMPLETE (UACMP) WITH MICROSCOPIC
Bilirubin Urine: NEGATIVE
Glucose, UA: NEGATIVE mg/dL
Ketones, ur: 5 mg/dL — AB
Nitrite: NEGATIVE
PH: 5 (ref 5.0–8.0)
Protein, ur: 30 mg/dL — AB
SPECIFIC GRAVITY, URINE: 1.015 (ref 1.005–1.030)

## 2017-09-11 MED ORDER — DEXTROSE 5 % IV SOLN
1.0000 g | Freq: Two times a day (BID) | INTRAVENOUS | Status: DC
  Filled 2017-09-11: qty 10

## 2017-09-11 MED ORDER — DEXTROSE-NACL 5-0.45 % IV SOLN
INTRAVENOUS | Status: DC
  Administered 2017-09-11: 01:00:00 via INTRAVENOUS

## 2017-09-11 MED ORDER — CEFAZOLIN SODIUM-DEXTROSE 1-4 GM-%(50ML) IV SOLR
1.0000 g | Freq: Two times a day (BID) | INTRAVENOUS | Status: DC
  Filled 2017-09-11: qty 50

## 2017-09-11 MED ORDER — DEXTROSE 5 % IV SOLN
INTRAVENOUS | Status: DC
  Administered 2017-09-11 – 2017-09-12 (×3): via INTRAVENOUS

## 2017-09-11 MED ORDER — DEXTROSE 5 % IV SOLN
1000.0000 mg | Freq: Two times a day (BID) | INTRAVENOUS | Status: DC
  Administered 2017-09-11 – 2017-09-12 (×2): 1000 mg via INTRAVENOUS
  Filled 2017-09-11 (×3): qty 10

## 2017-09-11 MED ORDER — CEFAZOLIN SODIUM-DEXTROSE 1-4 GM-%(50ML) IV SOLR
1.0000 g | Freq: Two times a day (BID) | INTRAVENOUS | Status: DC
  Administered 2017-09-11: 05:00:00 1 g via INTRAVENOUS
  Filled 2017-09-11 (×2): qty 50

## 2017-09-11 NOTE — Consult Note (Signed)
No change this AM. Pt is not following commands.     Past Medical History:  Diagnosis Date  . Chronic kidney disease   . Dementia   . Diabetes mellitus without complication (HCC)   . Hypertension   . Peripheral vascular disease [facility](HCC)     Past Surgical History:  Procedure Laterality Date  . ABOVE KNEE LEG AMPUTATION Left   . TRICEPS TENDON REPAIR      History reviewed. No pertinent family history.  Social History:  reports that he has quit smoking. he has never used smokeless tobacco. He reports that he does not drink alcohol or use drugs.  Allergies  Allergen Reactions  . Penicillin G Rash    Medications: I have reviewed the patient's current medications.  ROS: Unable to obtain as not following commands   Physical Examination: Blood pressure 103/66, pulse (!) 118, temperature (!) 97.4 F (36.3 C), temperature source Oral, resp. rate 16, height 6' (1.829 m), weight 115 lb (52.2 kg), SpO2 97 %.  Pt is very lethargic Not following commands  EOM intact Blinks to threat.  L AKA  Laboratory Studies:   Basic Metabolic Panel: Recent Labs  Lab 09/10/17 0135 09/10/17 0545 09/11/17 0514  NA 149*  --  152*  K 4.1  --  3.6  CL 117*  --  125*  CO2 23  --  19*  GLUCOSE 109*  --  104*  BUN 70*  --  55*  CREATININE 2.68*  --  1.75*  CALCIUM 8.2*  --  7.6*  MG  --  2.2  --     Liver Function Tests: Recent Labs  Lab 09/10/17 0135  AST 34  ALT 22  ALKPHOS 63  BILITOT 0.9  PROT 5.9*  ALBUMIN 2.5*   No results for input(s): LIPASE, AMYLASE in the last 168 hours. No results for input(s): AMMONIA in the last 168 hours.  CBC: Recent Labs  Lab 09/10/17 0135 09/11/17 0514  WBC 8.1 8.0  NEUTROABS 6.5  --   HGB 12.6* 10.8*  HCT 39.2* 33.8*  MCV 89.4 89.4  PLT 209 165    Cardiac Enzymes: Recent Labs  Lab 09/10/17 0135 09/10/17 0545 09/10/17 1118 09/10/17 1702  TROPONINI 0.14* 0.13* 0.09* 0.10*    BNP: Invalid input(s): POCBNP  CBG: Recent  Labs  Lab 09/10/17 0741 09/10/17 1339 09/10/17 1753 09/11/17 0459 09/11/17 1148  GLUCAP 117* 107* 96 90 99    Microbiology: Results for orders placed or performed during the hospital encounter of 09/10/17  Culture, blood (routine x 2)     Status: None (Preliminary result)   Collection Time: 09/10/17  3:07 AM  Result Value Ref Range Status   Specimen Description BLOOD RIGHT ANTECUBITAL  Final   Special Requests   Final    BOTTLES DRAWN AEROBIC AND ANAEROBIC Blood Culture adequate volume   Culture  Setup Time   Final    Organism ID to follow GRAM POSITIVE COCCI AEROBIC BOTTLE ONLY CRITICAL RESULT CALLED TO, READ BACK BY AND VERIFIED WITH: DAVID BESANTI ON 09/11/17 AT [id number] JAG Performed at [facility], [address] [place], [place] [address]    Culture [facility]  Final   Report Status PENDING  Incomplete  Culture, blood (routine x 2)     Status: None (Preliminary result)   Collection Time: 09/10/17  3:07 AM  Result Value Ref Range Status   Specimen Description BLOOD LEFT ANTECUBITAL  Final   Special Requests   Final  BOTTLES DRAWN AEROBIC AND ANAEROBIC Blood Culture adequate volume   Culture   Final    NO GROWTH 1 DAY Performed at [facility], [address] [place], [place] [address]    Report Status PENDING  Incomplete  Blood Culture ID Panel (Reflexed)     Status: Abnormal   Collection Time: 09/10/17  3:07 AM  Result Value Ref Range Status   Enterococcus species NOT DETECTED NOT DETECTED Final   Listeria monocytogenes NOT DETECTED NOT DETECTED Final   Staphylococcus species DETECTED (A) NOT DETECTED Final    Comment: Methicillin (oxacillin) susceptible coagulase negative staphylococcus. Possible blood culture contaminant (unless isolated from more than one blood culture draw or clinical case suggests pathogenicity). No antibiotic treatment is indicated for blood  culture contaminants. CRITICAL RESULT CALLED TO, READ BACK BY AND  VERIFIED WITH: DAVID BASENTI ON 09/11/17 AT 0336 JAG    Staphylococcus aureus NOT DETECTED NOT DETECTED Final   Methicillin resistance NOT DETECTED NOT DETECTED Final   Streptococcus species NOT DETECTED NOT DETECTED Final   Streptococcus agalactiae NOT DETECTED NOT DETECTED Final   Streptococcus pneumoniae NOT DETECTED NOT DETECTED Final   Streptococcus pyogenes NOT DETECTED NOT DETECTED Final   Acinetobacter baumannii NOT DETECTED NOT DETECTED Final   Enterobacteriaceae species NOT DETECTED NOT DETECTED Final   Enterobacter cloacae complex NOT DETECTED NOT DETECTED Final   Escherichia coli NOT DETECTED NOT DETECTED Final   Klebsiella oxytoca NOT DETECTED NOT DETECTED Final   Klebsiella pneumoniae NOT DETECTED NOT DETECTED Final   Proteus species NOT DETECTED NOT DETECTED Final   Serratia marcescens NOT DETECTED NOT DETECTED Final   Haemophilus influenzae NOT DETECTED NOT DETECTED Final   Neisseria meningitidis NOT DETECTED NOT DETECTED Final   Pseudomonas aeruginosa NOT DETECTED NOT DETECTED Final   Candida albicans NOT DETECTED NOT DETECTED Final   Candida glabrata NOT DETECTED NOT DETECTED Final   Candida krusei NOT DETECTED NOT DETECTED Final   Candida parapsilosis NOT DETECTED NOT DETECTED Final   Candida tropicalis NOT DETECTED NOT DETECTED Final    Comment: Performed at [facility], [address] [place], [place] [address]  MRSA PCR Screening     Status: None   Collection Time: 09/10/17  5:02 AM  Result Value Ref Range Status   MRSA by PCR NEGATIVE NEGATIVE Final    Comment:        The GeneXpert MRSA Assay (FDA approved for NASAL specimens only), is one component of a comprehensive MRSA colonization surveillance program. It is not intended to diagnose MRSA infection nor to guide or monitor treatment for MRSA infections. Performed at [facility], [address] [place], [place] [address]     Coagulation Studies: No results for input(s):  LABPROT, INR in the last 72 hours.  Urinalysis:  Recent Labs  Lab 09/11/17 0400  COLORURINE YELLOW*  LABSPEC 1.015  PHURINE 5.0  GLUCOSEU NEGATIVE  HGBUR SMALL*  BILIRUBINUR NEGATIVE  KETONESUR 5*  PROTEINUR 30*  NITRITE NEGATIVE  LEUKOCYTESUR LARGE*    Lipid Panel:     Component Value Date/Time   CHOL 157 09/10/2017 0135   TRIG 103 09/10/2017 0135   HDL 25 (L) 09/10/2017 0135   CHOLHDL 6.3 09/10/2017 0135   VLDL 21 09/10/2017 0135   LDLCALC 111 (H) 09/10/2017 0135    HgbA1C:  Lab Results  Component Value Date   HGBA1C 5.9 (H) 09/10/2017    Urine Drug Screen:  No results found for: LABOPIA, COCAINSCRNUR, LABBENZ, AMPHETMU, THCU, LABBARB  Alcohol Level: No results for input(s): ETH in the last 168 hours.    Imaging: Ct Head Wo Contrast  Result Date: 09/10/2017 CLINICAL DATA:  Acute onset of altered mental status. EXAM: CT HEAD WITHOUT CONTRAST TECHNIQUE: Contiguous axial images were obtained from the base of the skull through the vertex without intravenous contrast. COMPARISON:  CT of the head performed 05/08/2017 FINDINGS: Brain: There is an acute or subacute evolving infarct at the left occipital lobe, new from the prior study. There is no evidence of hemorrhagic transformation. No mass lesion is seen. There also appears to be a subacute right frontal subdural hematoma, new from the prior study, measuring 9 mm in thickness. Prominence of the ventricles and sulci reflects moderately severe cortical volume loss. Diffuse periventricular and subcortical white matter change likely reflects small vessel ischemic microangiopathy. Cerebellar atrophy is noted, with underlying chronic cerebellar infarcts. The brainstem and fourth ventricle are within normal limits. The basal ganglia are unremarkable in appearance. No midline shift is seen. Vascular: No hyperdense vessel or unexpected calcification. Skull: There is no evidence of fracture; visualized osseous structures are  unremarkable in appearance. Sinuses/Orbits: The visualized portions of the orbits are within normal limits. The paranasal sinuses and mastoid air cells are well-aerated. Other: No significant soft tissue abnormalities are seen. IMPRESSION: 1. Acute or subacute evolving infarct at the left occipital lobe, new from the prior study. No evidence of hemorrhagic transformation. 2. Subacute right frontal subdural hematoma, new from the prior study, measuring 9 mm in thickness. 3. Moderately severe cortical volume loss and diffuse small vessel ischemic microangiopathy. 4. Chronic cerebellar infarcts noted, with associated encephalomalacia. Critical Value/emergent results were called by telephone at the time of interpretation on 09/10/2017 at 2:48 am to Dr. Chiquita LothJADE SUNG, who verbally acknowledged these results. Electronically Signed   By: Roanna RaiderJeffery  Chang M.D.   On: 09/10/2017 02:50   [place] Carotid Bilateral (at [facility] Only)  Result Date: 09/10/2017 CLINICAL DATA:  Altered mental status.  CVA. EXAM: BILATERAL CAROTID DUPLEX ULTRASOUND TECHNIQUE: Wallace CullensGray scale imaging, color Doppler and duplex ultrasound were performed of bilateral carotid and vertebral arteries in the neck. COMPARISON:  10/23/2007 FINDINGS: Criteria: Quantification of carotid stenosis is based on velocity parameters that correlate the residual internal carotid diameter with NASCET-based stenosis levels, using the diameter of the distal internal carotid lumen as the denominator for stenosis measurement. The following velocity measurements were obtained: RIGHT ICA:  170 cm/sec CCA:  55 cm/sec SYSTOLIC ICA/CCA RATIO:  3.1 DIASTOLIC ICA/CCA RATIO:  9.3 ECA:  109 cm/sec LEFT ICA:  65 cm/sec CCA:  105 cm/sec SYSTOLIC ICA/CCA RATIO:  0.6 DIASTOLIC ICA/CCA RATIO:  2.2 ECA:  51 cm/sec RIGHT CAROTID ARTERY: The plaque in the right common carotid artery. Large amount of calcified plaque with shadowing at the right carotid bulb. Limited evaluation of right carotid bulb  due to the posterior acoustic shadowing. External carotid artery is patent with normal waveform. Peak systolic velocity in the proximal internal carotid artery is 170 cm/sec. RIGHT VERTEBRAL ARTERY: Antegrade flow and normal waveform in the right vertebral artery. LEFT CAROTID ARTERY: Left common carotid artery is very torturous. Echogenic plaque at the left carotid bulb. External carotid artery is patent with normal waveform. Echogenic plaque in the proximal internal carotid artery. Normal waveforms and velocities in the internal carotid artery. LEFT VERTEBRAL ARTERY: Antegrade flow and normal waveform in the left vertebral artery. IMPRESSION: Moderate atherosclerotic disease in the carotid arteries, right side greater than left. Estimated degree of stenosis  in right internal carotid artery is 50-69%. Incomplete evaluation of the right carotid bulb due to extensive calcified plaque that causes posterior acoustic shadowing. Estimated degree of stenosis in left internal carotid artery is less than 50%. Patent vertebral arteries with antegrade flow. Electronically Signed   By: Richarda Overlie M.D.   On: 09/10/2017 13:22   Dg Chest Port 1 View  Result Date: 09/10/2017 CLINICAL DATA:  Acute onset of altered mental status. EXAM: PORTABLE CHEST 1 VIEW COMPARISON:  Chest radiograph performed 12/22/2014 FINDINGS: The lungs are mildly hypoexpanded. Haziness at the right lung may reflect atelectasis or possibly mild infection, depending on the patient's symptoms. There is no evidence of pleural effusion or pneumothorax. The cardiomediastinal silhouette is normal in size. The patient is status post median sternotomy, with several chronically fractured superior sternal wires. A pacemaker is noted overlying the left chest wall, with leads ending overlying the right atrium and right ventricle. No acute osseous abnormalities are seen. IMPRESSION: Lungs mildly hypoexpanded. Haziness at the right lung may reflect atelectasis or  possibly mild infection, depending on the patient's symptoms. Electronically Signed   By: Roanna Raider M.D.   On: 09/10/2017 01:51     Assessment/Plan:  83 y.o. male male with a known history of chronic kidney disease, diabetes mellitus type 2, dementia, peripheral vascular disease, hypertension was referred from the nursing home for confusion and decreased responsiveness. Patient was worked up with CT head which showed subacute left occipital lobe ischemia as well as subacute SDH R frontal lobe.   no hemorrhagic transformation.CThead also showed subacute right frontal subdural hematoma 9mm  - Pt is lethargic likely multifactorial in setting of dehydration/sepsis/metabolic encephalopathy as well as intracranial pathology - case d/w primary team and agree with palliative care evaluation  - No further imaging from neuro stand point.  Pauletta Browns  09/11/2017, 2:46 PM

## 2017-09-11 NOTE — Plan of Care (Signed)
  Health Behavior/Discharge Planning: Ability to manage health-related needs will improve 09/11/2017 1427 - Not Progressing by Garwin Brothershomas, Ikaika Showers Lynn, RN  Dr Cherlynn KaiserSainani ordered Palliative Care for 09/12/17 after speaking with the pt's daughter; [facility]

## 2017-09-11 NOTE — Progress Notes (Signed)
Initial Nutrition Assessment  DOCUMENTATION CODES:   Severe malnutrition in context of chronic illness, Underweight  INTERVENTION:  No appropriate nutrition intervention at this time as patient is NPO and unsafe for oral intake.   Will continue to monitor outcome of discussions regarding goals of care and make recommendations for nutrition interventions as indicated.  NUTRITION DIAGNOSIS:   Severe Malnutrition related to chronic illness(dementia, CKD IV, pressure ulcers) as evidenced by severe fat depletion, severe muscle depletion.  GOAL:   Patient will meet greater than or equal to 90% of their needs  MONITOR:   Diet advancement, Labs, Weight trends, Skin, I & O's  REASON FOR ASSESSMENT:   Malnutrition Screening Tool    ASSESSMENT:   83 year old male with PMHx of dementia, CKD stage IV, PVD, HTN, DM type 2, hx left AKA who presented with AMS found to have subacute CVA in occipital lobe and subacute right frontal subdural hematoma on CT of head.   -Per chart patient's family does not want any aggressive care or neurosurgery.  Reviewed chart and discussed with RN. Patient has been unresponsive. Patient is unsafe for PO intake. Possible transition to comfort care pending goals of care discussion. Patient unable to provide history and no family members present. No accurate weight history in chart to trend.  Medications reviewed and include: Novolog 0-9 units Q6hrs, cefazolin, D5W at 100 mL/hr (120 grams dextrose; 408 kcal daily).  Labs reviewed: CBG 90-96, Sodium 152, Chloride 125, CO2 19, BUN 55, Creatinine 1.75.  NUTRITION - FOCUSED PHYSICAL EXAM:    Most Recent Value  Orbital Region  Severe depletion  Upper Arm Region  Severe depletion  Thoracic and Lumbar Region  Unable to assess  Buccal Region  Severe depletion  Temple Region  Severe depletion  Clavicle Bone Region  Severe depletion  Clavicle and Acromion Bone Region  Severe depletion  Scapular Bone Region  Unable  to assess  Dorsal Hand  Unable to assess  Patellar Region  Unable to assess  Anterior Thigh Region  Unable to assess  Posterior Calf Region  Unable to assess  Edema (RD Assessment)  Unable to assess  Hair  Reviewed  Eyes  Unable to assess  Mouth  Unable to assess  Skin  Unable to assess  Nails  Unable to assess     Diet Order:  Diet NPO time specified  EDUCATION NEEDS:   Not appropriate for education at this time  Skin:  Skin Assessment: Skin Integrity Issues: Skin Integrity Issues:: Stage II, Unstageable, Other (Comment) Stage II: right ankle Unstageable: right heel and right ankle Other: venous stasis ulcers/open wounds to right leg  Last BM:  Unknown  Height:   Ht Readings from Last 1 Encounters:  09/10/17 6' (1.829 m)    Weight:   Wt Readings from Last 1 Encounters:  09/10/17 115 lb (52.2 kg)    Ideal Body Weight:  74 kg  BMI:  Body mass index is 15.6 kg/m.  Estimated Nutritional Needs:   Kcal:  1300-1600 (25-30 kcal/kg)  Protein:  80-90 grams (1.5-1.7 grams/kg)  Fluid:  1.3 L/day (25 mL/kg)  Helane RimaLeanne Deleon Passe, MS, RD, LDN Office: [phone] Pager: [phone] After Hours/Weekend Pager: [phone]

## 2017-09-11 NOTE — Progress Notes (Signed)
PHARMACY - PHYSICIAN COMMUNICATION CRITICAL VALUE ALERT - BLOOD CULTURE IDENTIFICATION (BCID)  Jack French is an 83 y.o. male who [facility] with a chief complaint of AMS  Assessment:  Patient admitted d/t non-responsiveness from group home, was hypotensive on arrival, CT showed a occipital lobe infarct w/ subdural hematoma, patient was called a code sepsis and placed on meropenem and vancomycin, but only received one dose of vanc (include suspected source if known)  Name of physician (or Provider) Contacted: Pavan Pyreddy  Current antibiotics: Meropenem and vancomycin, will de-escalate to cefazolin 1g IV q12h d/t renal function for MSSA bacteremia.  Changes to prescribed antibiotics recommended:  Recommendations accepted by provider  Results for orders placed or performed during the hospital encounter of 09/10/17  Blood Culture ID Panel (Reflexed) (Collected: 09/10/2017  3:07 AM)  Result Value Ref Range   Enterococcus species NOT DETECTED NOT DETECTED   Listeria monocytogenes NOT DETECTED NOT DETECTED   Staphylococcus species DETECTED (A) NOT DETECTED   Staphylococcus aureus NOT DETECTED NOT DETECTED   Methicillin resistance NOT DETECTED NOT DETECTED   Streptococcus species NOT DETECTED NOT DETECTED   Streptococcus agalactiae NOT DETECTED NOT DETECTED   Streptococcus pneumoniae NOT DETECTED NOT DETECTED   Streptococcus pyogenes NOT DETECTED NOT DETECTED   Acinetobacter baumannii NOT DETECTED NOT DETECTED   Enterobacteriaceae species NOT DETECTED NOT DETECTED   Enterobacter cloacae complex NOT DETECTED NOT DETECTED   Escherichia coli NOT DETECTED NOT DETECTED   Klebsiella oxytoca NOT DETECTED NOT DETECTED   Klebsiella pneumoniae NOT DETECTED NOT DETECTED   Proteus species NOT DETECTED NOT DETECTED   Serratia marcescens NOT DETECTED NOT DETECTED   Haemophilus influenzae NOT DETECTED NOT DETECTED   Neisseria meningitidis NOT DETECTED NOT DETECTED   Pseudomonas aeruginosa NOT DETECTED NOT DETECTED   Candida albicans NOT DETECTED NOT DETECTED   Candida glabrata NOT DETECTED NOT DETECTED   Candida krusei NOT DETECTED NOT DETECTED   Candida parapsilosis NOT DETECTED NOT DETECTED   Candida tropicalis NOT DETECTED NOT DETECTED    Thomasene Rippleavid Caniya Tagle, PharmD, BCPS Clinical Pharmacist 09/11/2017

## 2017-09-11 NOTE — Progress Notes (Signed)
[facility] [place] [facility]   PATIENT NAME: Jack French    MR#:  [id number]  DATE OF BIRTH:  Mar 13, 1924  SUBJECTIVE:   Patient here due to a subacute CVA and also a subacute subdural hematoma. A little more awake today but difficult to understand. Noted to be severely hypernatremic this morning. Difficult to understand.  REVIEW OF SYSTEMS:    Review of Systems  Unable to perform ROS: Mental acuity    Nutrition: NPO Tolerating Diet: No Tolerating PT: Await Eval.   DRUG ALLERGIES:   Allergies  Allergen Reactions  . Penicillin G Rash    VITALS:  Blood pressure 127/61, pulse 64, temperature 97.8 F (36.6 C), resp. rate 16, height 6' (1.829 m), weight 52.2 kg (115 lb), SpO2 100 %.  PHYSICAL EXAMINATION:   Physical Exam  GENERAL:  83 y.o.-year-old patient lying in bed lethargic, Encephalopathic in NAD.  EYES: Pupils equal, round, reactive to light. No scleral icterus. Extraocular muscles intact.  HEENT: Head atraumatic, normocephalic. Oropharynx and nasopharynx clear.  Dry Oral Mucosa.  NECK:  Supple, no jugular venous distention. No thyroid enlargement, no tenderness.  LUNGS: Poor Resp. effort, no wheezing, rales, rhonchi. No use of accessory muscles of respiration.  CARDIOVASCULAR: S1, S2 normal. No murmurs, rubs, or gallops.  ABDOMEN: Soft, nontender, nondistended. Bowel sounds present. No organomegaly or mass.  EXTREMITIES: No cyanosis, clubbing or edema b/l.   Left AKA NEUROLOGIC: Cranial nerves II through XII are intact. No focal Motor or sensory deficits b/l. Globally weak.   PSYCHIATRIC: The patient is alert and oriented x 1.  SKIN: No obvious rash, lesion, or ulcer.    LABORATORY PANEL:   CBC Recent Labs  Lab 09/11/17 0514  WBC 8.0  HGB 10.8*  HCT 33.8*  PLT 165   ------------------------------------------------------------------------------------------------------------------  Chemistries  Recent Labs  Lab 09/10/17 0135  09/10/17 0545 09/11/17 0514  NA 149*  --  152*  K 4.1  --  3.6  CL 117*  --  125*  CO2 23  --  19*  GLUCOSE 109*  --  104*  BUN 70*  --  55*  CREATININE 2.68*  --  1.75*  CALCIUM 8.2*  --  7.6*  MG  --  2.2  --   AST 34  --   --   ALT 22  --   --   ALKPHOS 63  --   --   BILITOT 0.9  --   --    ------------------------------------------------------------------------------------------------------------------  Cardiac Enzymes Recent Labs  Lab 09/10/17 1702  TROPONINI 0.10*   ------------------------------------------------------------------------------------------------------------------  RADIOLOGY:  Ct Head Wo Contrast  Result Date: 09/10/2017 CLINICAL DATA:  Acute onset of altered mental status. EXAM: CT HEAD WITHOUT CONTRAST TECHNIQUE: Contiguous axial images were obtained from the base of the skull through the vertex without intravenous contrast. COMPARISON:  CT of the head performed 05/08/2017 FINDINGS: Brain: There is an acute or subacute evolving infarct at the left occipital lobe, new from the prior study. There is no evidence of hemorrhagic transformation. No mass lesion is seen. There also appears to be a subacute right frontal subdural hematoma, new from the prior study, measuring 9 mm in thickness. Prominence of the ventricles and sulci reflects moderately severe cortical volume loss. Diffuse periventricular and subcortical white matter change likely reflects small vessel ischemic microangiopathy. Cerebellar atrophy is noted, with underlying chronic cerebellar infarcts. The brainstem and fourth ventricle are within normal limits. The basal ganglia are unremarkable in appearance. No midline  shift is seen. Vascular: No hyperdense vessel or unexpected calcification. Skull: There is no evidence of fracture; visualized osseous structures are unremarkable in appearance. Sinuses/Orbits: The visualized portions of the orbits are within normal limits. The paranasal sinuses and mastoid  air cells are well-aerated. Other: No significant soft tissue abnormalities are seen. IMPRESSION: 1. Acute or subacute evolving infarct at the left occipital lobe, new from the prior study. No evidence of hemorrhagic transformation. 2. Subacute right frontal subdural hematoma, new from the prior study, measuring 9 mm in thickness. 3. Moderately severe cortical volume loss and diffuse small vessel ischemic microangiopathy. 4. Chronic cerebellar infarcts noted, with associated encephalomalacia. Critical Value/emergent results were called by telephone at the time of interpretation on 09/10/2017 at 2:48 am to Dr. Chiquita LothJADE SUNG, who verbally acknowledged these results. Electronically Signed   By: Roanna RaiderJeffery  Chang M.D.   On: 09/10/2017 02:50   [place] Carotid Bilateral (at [facility] Only)  Result Date: 09/10/2017 CLINICAL DATA:  Altered mental status.  CVA. EXAM: BILATERAL CAROTID DUPLEX ULTRASOUND TECHNIQUE: Wallace CullensGray scale imaging, color Doppler and duplex ultrasound were performed of bilateral carotid and vertebral arteries in the neck. COMPARISON:  10/23/2007 FINDINGS: Criteria: Quantification of carotid stenosis is based on velocity parameters that correlate the residual internal carotid diameter with NASCET-based stenosis levels, using the diameter of the distal internal carotid lumen as the denominator for stenosis measurement. The following velocity measurements were obtained: RIGHT ICA:  170 cm/sec CCA:  55 cm/sec SYSTOLIC ICA/CCA RATIO:  3.1 DIASTOLIC ICA/CCA RATIO:  9.3 ECA:  109 cm/sec LEFT ICA:  65 cm/sec CCA:  105 cm/sec SYSTOLIC ICA/CCA RATIO:  0.6 DIASTOLIC ICA/CCA RATIO:  2.2 ECA:  51 cm/sec RIGHT CAROTID ARTERY: The plaque in the right common carotid artery. Large amount of calcified plaque with shadowing at the right carotid bulb. Limited evaluation of right carotid bulb due to the posterior acoustic shadowing. External carotid artery is patent with normal waveform. Peak systolic velocity in the proximal  internal carotid artery is 170 cm/sec. RIGHT VERTEBRAL ARTERY: Antegrade flow and normal waveform in the right vertebral artery. LEFT CAROTID ARTERY: Left common carotid artery is very torturous. Echogenic plaque at the left carotid bulb. External carotid artery is patent with normal waveform. Echogenic plaque in the proximal internal carotid artery. Normal waveforms and velocities in the internal carotid artery. LEFT VERTEBRAL ARTERY: Antegrade flow and normal waveform in the left vertebral artery. IMPRESSION: Moderate atherosclerotic disease in the carotid arteries, right side greater than left. Estimated degree of stenosis in right internal carotid artery is 50-69%. Incomplete evaluation of the right carotid bulb due to extensive calcified plaque that causes posterior acoustic shadowing. Estimated degree of stenosis in left internal carotid artery is less than 50%. Patent vertebral arteries with antegrade flow. Electronically Signed   By: Richarda OverlieAdam  Henn M.D.   On: 09/10/2017 13:22   Dg Chest Port 1 View  Result Date: 09/10/2017 CLINICAL DATA:  Acute onset of altered mental status. EXAM: PORTABLE CHEST 1 VIEW COMPARISON:  Chest radiograph performed 12/22/2014 FINDINGS: The lungs are mildly hypoexpanded. Haziness at the right lung may reflect atelectasis or possibly mild infection, depending on the patient's symptoms. There is no evidence of pleural effusion or pneumothorax. The cardiomediastinal silhouette is normal in size. The patient is status post median sternotomy, with several chronically fractured superior sternal wires. A pacemaker is noted overlying the left chest wall, with leads ending overlying the right atrium and right ventricle. No acute osseous abnormalities are seen. IMPRESSION: Lungs mildly  hypoexpanded. Haziness at the right lung may reflect atelectasis or possibly mild infection, depending on the patient's symptoms. Electronically Signed   By: Roanna Raider M.D.   On: 09/10/2017 01:51      ASSESSMENT AND PLAN:   83 year old male with past medical history of advanced dementia, hypertension, diabetes, peripheral vascular disease status post left AKA, chronic kidney disease stage IV who presents to the hospital due to altered mental status.  1. Altered mental status-secondary to underlying dementia combined with suspected CVA and also a subacute subdural hematoma. -Patient's CT head on admission showed a subacute occipital CVA and also a right frontal subacute subdural hematoma.  -patient's family does not want to pursue neurosurgical intervention for the subdural hematoma.  -Continue aspirin suppository, cannot get MRI as pt. Has a pacemaker, Carotid duplex (-) for Hemodynamically significant stenosis.  - Echo showing severe LV dysfunction with EF 25-30%, but no evidence of intracardiac thrombus.  2. Right-sided subdural hematoma-this was noted incidentally on the CT scan of head on admission. Patient's family does not want to pursue aggressive care or any neurosurgical intervention.  3. Hypernatremia-secondary to poor by mouth intake and dehydration given advanced dementia.  -continue D5W, follow sodium.  4. diabetes type 2 without complication-continue SSI and follow sugars.   5. Aspiration pneumonia- cont. Ancef for now.    6. Dementia with behavioral disturbance-continue Depakote.  7. CKD Stage IV - Cr. At baseline and will cont. To monitor.   Discussed plan of care with the patient's daughter over the phone. Patient's prognosis is poor given his advanced dementia, vascular disease and now with the suspected stroke and subdural hematoma. Patient's daughter is in agreement with palliative care and hospice, we'll get palliative care consult in a.m. tomorrow.   All the records are reviewed and case discussed with Care Management/Social Worker. Management plans discussed with the patient, family and they are in agreement.  CODE STATUS: DO NOT RESUSCITATE  DVT  Prophylaxis: Teds and SCDs  TOTAL TIME TAKING CARE OF THIS PATIENT: 30 minutes.   POSSIBLE D/C IN 1-2 DAYS, DEPENDING ON CLINICAL CONDITION.   Houston Siren M.D on 09/11/2017 at 11:55 AM  Between 7am to 6pm - Pager - [phone]  After 6pm go to [email or web address] - Social research officer, government  [facility] [place] [facility]  Office  [phone]  CC: Primary care physician; Dimple Casey, MD

## 2017-09-12 DIAGNOSIS — R4182 Altered mental status, unspecified: Secondary | ICD-10-CM

## 2017-09-12 DIAGNOSIS — F0150 Vascular dementia without behavioral disturbance: Secondary | ICD-10-CM

## 2017-09-12 DIAGNOSIS — Z515 Encounter for palliative care: Secondary | ICD-10-CM

## 2017-09-12 DIAGNOSIS — S065XAA Traumatic subdural hemorrhage with loss of consciousness status unknown, initial encounter: Secondary | ICD-10-CM

## 2017-09-12 DIAGNOSIS — I639 Cerebral infarction, unspecified: Principal | ICD-10-CM

## 2017-09-12 DIAGNOSIS — S065X9A Traumatic subdural hemorrhage with loss of consciousness of unspecified duration, initial encounter: Secondary | ICD-10-CM

## 2017-09-12 DIAGNOSIS — E43 Unspecified severe protein-calorie malnutrition: Secondary | ICD-10-CM

## 2017-09-12 LAB — CREATININE, SERUM
Creatinine, Ser: 1.41 mg/dL — ABNORMAL HIGH (ref 0.61–1.24)
GFR calc Af Amer: 49 mL/min — ABNORMAL LOW (ref >60)
GFR calc non Af Amer: 42 mL/min — ABNORMAL LOW (ref >60)

## 2017-09-12 LAB — GLUCOSE, CAPILLARY
GLUCOSE-CAPILLARY: 143 mg/dL — AB (ref 65–99)
GLUCOSE-CAPILLARY: 75 mg/dL (ref 65–99)

## 2017-09-12 MED ORDER — GLYCOPYRROLATE 1 MG PO TABS
1.0000 mg | ORAL_TABLET | ORAL | Status: DC | PRN
  Filled 2017-09-12: qty 1

## 2017-09-12 MED ORDER — GLYCOPYRROLATE 0.2 MG/ML IJ SOLN
0.2000 mg | INTRAMUSCULAR | Status: DC | PRN
  Filled 2017-09-12: qty 1

## 2017-09-12 MED ORDER — LORAZEPAM 2 MG/ML IJ SOLN
1.0000 mg | INTRAMUSCULAR | Status: DC | PRN
  Administered 2017-09-13: 15:00:00 1 mg via INTRAVENOUS
  Filled 2017-09-12: qty 1

## 2017-09-12 MED ORDER — MORPHINE SULFATE (PF) 2 MG/ML IV SOLN
1.0000 mg | INTRAVENOUS | Status: DC | PRN
  Administered 2017-09-12: 17:00:00 1 mg via INTRAVENOUS
  Administered 2017-09-13: 09:00:00 2 mg via INTRAVENOUS
  Filled 2017-09-12 (×2): qty 1

## 2017-09-12 MED ORDER — HALOPERIDOL 0.5 MG PO TABS
0.5000 mg | ORAL_TABLET | ORAL | Status: DC | PRN
  Filled 2017-09-12: qty 1

## 2017-09-12 MED ORDER — MORPHINE SULFATE (CONCENTRATE) 10 MG/0.5ML PO SOLN: 5.0000 mg | ORAL | Status: DC | PRN

## 2017-09-12 MED ORDER — ONDANSETRON 4 MG PO TBDP
4.0000 mg | ORAL_TABLET | Freq: Four times a day (QID) | ORAL | Status: DC | PRN
  Filled 2017-09-12: qty 1

## 2017-09-12 MED ORDER — POLYVINYL ALCOHOL 1.4 % OP SOLN
1.0000 [drp] | Freq: Four times a day (QID) | OPHTHALMIC | Status: DC | PRN
  Filled 2017-09-12: qty 15

## 2017-09-12 MED ORDER — HALOPERIDOL LACTATE 5 MG/ML IJ SOLN: 0.5000 mg | INTRAMUSCULAR | Status: DC | PRN

## 2017-09-12 MED ORDER — BIOTENE DRY MOUTH MT LIQD: 15.0000 mL | OROMUCOSAL | Status: DC | PRN

## 2017-09-12 MED ORDER — LORAZEPAM 2 MG/ML PO CONC: 1.0000 mg | ORAL | Status: DC | PRN

## 2017-09-12 MED ORDER — HALOPERIDOL LACTATE 2 MG/ML PO CONC
0.5000 mg | ORAL | Status: DC | PRN
  Filled 2017-09-12: qty 0.3

## 2017-09-12 MED ORDER — LORAZEPAM 1 MG PO TABS: 1.0000 mg | ORAL_TABLET | ORAL | Status: DC | PRN

## 2017-09-12 MED ORDER — ONDANSETRON HCL 4 MG/2ML IJ SOLN: 4.0000 mg | Freq: Four times a day (QID) | INTRAMUSCULAR | Status: DC | PRN

## 2017-09-12 NOTE — Progress Notes (Signed)
[facility] - [place] [facility]   PATIENT NAME: Della GooBolivar Adel    MR#:  [id number]  DATE OF BIRTH:  07-16-1924  SUBJECTIVE:   Patient here due to a subacute CVA and also a subacute subdural hematoma. Patient also has advanced underlying dementia. Prognosis poor. Mental status has not improved. Palliative care discussed with both daughters today and patient is now comfort care only awaiting hospice home placement.  REVIEW OF SYSTEMS:    Review of Systems  Unable to perform ROS: Mental acuity    DRUG ALLERGIES:   Allergies  Allergen Reactions  . Penicillin G Rash    VITALS:  Blood pressure 123/71, pulse 66, temperature (!) 97.5 F (36.4 C), temperature source Oral, resp. rate 18, height 6' (1.829 m), weight 52.2 kg (115 lb), SpO2 98 %.  PHYSICAL EXAMINATION:   Physical Exam  GENERAL:  83 y.o.-year-old patient lying in bed lethargic, Encephalopathic in NAD.  EYES: Pupils equal, round, reactive to light. No scleral icterus.  HEENT: Head atraumatic, normocephalic. Oropharynx and nasopharynx clear.  Dry Oral Mucosa.  NECK:  Supple, no jugular venous distention. No thyroid enlargement, no tenderness.  LUNGS: Poor Resp. effort, no wheezing, rales, rhonchi. No use of accessory muscles of respiration.  CARDIOVASCULAR: S1, S2 normal. No murmurs, rubs, or gallops.  ABDOMEN: Soft, nontender, nondistended. Bowel sounds present. No organomegaly or mass.  EXTREMITIES: No cyanosis, clubbing or edema b/l.  Left AKA NEUROLOGIC: Cranial nerves II through XII are intact. No focal Motor or sensory deficits b/l. Globally weak.   PSYCHIATRIC: The patient is alert and oriented x 1.  SKIN: No obvious rash, lesion, or ulcer.    LABORATORY PANEL:   CBC Recent Labs  Lab 09/11/17 0514  WBC 8.0  HGB 10.8*  HCT 33.8*  PLT 165   ------------------------------------------------------------------------------------------------------------------  Chemistries  Recent Labs   Lab 09/10/17 0135 09/10/17 0545 09/11/17 0514 09/12/17 0516  NA 149*  --  152*  --   K 4.1  --  3.6  --   CL 117*  --  125*  --   CO2 23  --  19*  --   GLUCOSE 109*  --  104*  --   BUN 70*  --  55*  --   CREATININE 2.68*  --  1.75* 1.41*  CALCIUM 8.2*  --  7.6*  --   MG  --  2.2  --   --   AST 34  --   --   --   ALT 22  --   --   --   ALKPHOS 63  --   --   --   BILITOT 0.9  --   --   --    ------------------------------------------------------------------------------------------------------------------  Cardiac Enzymes Recent Labs  Lab 09/10/17 1702  TROPONINI 0.10*   ------------------------------------------------------------------------------------------------------------------  RADIOLOGY:  No results found.   ASSESSMENT AND PLAN:   83 year old male with past medical history of advanced dementia, hypertension, diabetes, peripheral vascular disease status post left AKA, chronic kidney disease stage IV who presents to the hospital due to altered mental status.  1. Altered mental status-secondary to underlying dementia combined with suspected CVA and also a subacute subdural hematoma. -Patient's CT head on admission showed a subacute occipital CVA and also a right frontal subacute subdural hematoma.  2. Right-sided subdural hematoma-this was noted incidentally on the CT scan of head on admission. Patient's family does not want to pursue aggressive care or any neurosurgical intervention. 3. Hypernatremia -  Due  to severe dehydration and poor PO intake.  4. diabetes type 2 without complication 5. Aspiration pneumonia 6. Dementia with behavioral disturbance 7. CKD Stage IV  Patient's prognosis is very poor given his advanced dementia and now with CVA and also a subacute subdural hematoma. Patient has clinically not improved despite supportive therapy over the past 24-48 hours. Palliative care consult was obtained to discuss goals of care with patient's daughters. Patient  is now comfort care only and is awaiting hospice program placement. -Continue morphine, Ativan as needed. Continue glycopyrrolate for secretions.   All the records are reviewed and case discussed with Care Management/Social Worker. Management plans discussed with the patient, family and they are in agreement.  CODE STATUS: DO NOT RESUSCITATE  DVT Prophylaxis: Teds and SCDs  TOTAL TIME TAKING CARE OF THIS PATIENT: 30 minutes.   POSSIBLE D/C IN 1-2 DAYS, DEPENDING ON CLINICAL CONDITION.   [place] SirenSAINANI,VIVEK J M.D on 09/12/2017 at 1:30 PM  Between 7am to 6pm - Pager - [phone]  After 6pm go to [email or web address] - Social research officer, governmentpassword EPAS [facility]  [facility] [place] [facility]  Office  [phone]  CC: Primary care physician; Dimple CaseySmith, Sean A, MD

## 2017-09-12 NOTE — Progress Notes (Signed)
Pt now unresponsive to voice and pain. MD notified of change in LOC. Will continue to monitor. Otilio JeffersonMadelyn S Fenton, RN

## 2017-09-12 NOTE — [place] FL2 (Signed)
[place] MEDICAID FL2 LEVEL OF CARE SCREENING TOOL     IDENTIFICATION  Patient Name: Jack French Birthdate: [birth year] Sex: male Admission Date (Current Location): 09/10/2017  [place]  [place] ([id number] [facility]) Facility and Address:  [facility], [address], [place], [place] [address]      Provider Number: [id number]  Attending Physician Name and Address:  Houston SirenSainani, Vivek J, MD  Relative Name and Phone Number:       Current Level of Care: Hospital Recommended Level of Care: Skilled Nursing Facility Prior Approval Number:    Date Approved/Denied:   PASRR Number: ([id number] [id number] )  Discharge Plan: SNF    Current Diagnoses: Patient Active Problem List   Diagnosis Date Noted  . Protein-calorie malnutrition, severe 09/12/2017  . Altered mental status   . SDH (subdural hematoma) (HCC)   . Vascular dementia without behavioral disturbance   . Palliative care encounter   . Sepsis (HCC) 09/10/2017  . CVA (cerebral vascular accident) (HCC) 09/10/2017  . Pressure injury of skin 09/10/2017  . Atherosclerosis of native arteries of the extremities with ulceration (HCC) 07/08/2016  . Status post above knee amputation (HCC) 07/08/2016  . Coronary artery disease 07/08/2016  . Essential hypertension 07/08/2016  . Hyperlipidemia 07/08/2016    Orientation RESPIRATION BLADDER Height & Weight     Self  Normal Incontinent Weight: 115 lb (52.2 kg) Height:  6' (182.9 cm)  BEHAVIORAL SYMPTOMS/MOOD NEUROLOGICAL BOWEL NUTRITION STATUS      Incontinent Diet(NPO )  AMBULATORY STATUS COMMUNICATION OF NEEDS Skin   Total Care Verbally PU Stage and Appropriate Care(pressure ulcers on heels and ankles. )                       Personal Care Assistance Level of Assistance  Bathing, Feeding, Dressing, Total care Bathing Assistance: Maximum assistance Feeding assistance: Maximum assistance Dressing Assistance: Maximum assistance Total  Care Assistance: Maximum assistance   Functional Limitations Info  Sight, Hearing, Speech Sight Info: Adequate Hearing Info: Adequate Speech Info: Adequate    SPECIAL CARE FACTORS FREQUENCY                       Contractures      Additional Factors Info  Code Status, Allergies Code Status Info: (DNR ) Allergies Info: (Penicillin. )           Current Medications (09/12/2017):  This is the current hospital active medication list Current Facility-Administered Medications  Medication Dose Route Frequency Provider Last Rate Last Dose  . acetaminophen (TYLENOL) tablet 650 mg  650 mg Oral Q6H PRN Ihor AustinPyreddy, Pavan, MD       Or  . acetaminophen (TYLENOL) suppository 650 mg  650 mg Rectal Q6H PRN Pyreddy, Vivien RotaPavan, MD      . antiseptic oral rinse (BIOTENE) solution 15 mL  15 mL Topical PRN Dellinger, Tora KindredMarianne L, PA-C      . glycopyrrolate (ROBINUL) tablet 1 mg  1 mg Oral Q4H PRN Dellinger, Clerance LavMarianne L, PA-C       Or  . glycopyrrolate (ROBINUL) injection 0.2 mg  0.2 mg Subcutaneous Q4H PRN Dellinger, Clerance LavMarianne L, PA-C       Or  . glycopyrrolate (ROBINUL) injection 0.2 mg  0.2 mg Intravenous Q4H PRN Dellinger, Clerance LavMarianne L, PA-C      . haloperidol (HALDOL) tablet 0.5 mg  0.5 mg Oral Q4H PRN Dellinger, Marianne L, PA-C       Or  . haloperidol (HALDOL)  2 MG/ML solution 0.5 mg  0.5 mg Sublingual Q4H PRN Dellinger, Clerance LavMarianne L, PA-C       Or  . haloperidol lactate (HALDOL) injection 0.5 mg  0.5 mg Intravenous Q4H PRN Dellinger, Tora KindredMarianne L, PA-C      . LORazepam (ATIVAN) tablet 1 mg  1 mg Oral Q4H PRN Dellinger, Tora KindredMarianne L, PA-C       Or  . LORazepam (ATIVAN) 2 MG/ML concentrated solution 1 mg  1 mg Sublingual Q4H PRN Dellinger, Tora KindredMarianne L, PA-C       Or  . LORazepam (ATIVAN) injection 1 mg  1 mg Intravenous Q4H PRN Dellinger, Clerance LavMarianne L, PA-C      . morphine 2 MG/ML injection 1-2 mg  1-2 mg Intravenous Q4H PRN Dellinger, Clerance LavMarianne L, PA-C      . morphine CONCENTRATE 10 MG/0.5ML oral  solution 5 mg  5 mg Oral Q2H PRN Dellinger, Clerance LavMarianne L, PA-C       Or  . morphine CONCENTRATE 10 MG/0.5ML oral solution 5 mg  5 mg Sublingual Q2H PRN Dellinger, Clerance LavMarianne L, PA-C      . ondansetron (ZOFRAN) tablet 4 mg  4 mg Oral Q6H PRN Pyreddy, Vivien RotaPavan, MD       Or  . ondansetron (ZOFRAN) injection 4 mg  4 mg Intravenous Q6H PRN Pyreddy, Pavan, MD      . ondansetron (ZOFRAN-ODT) disintegrating tablet 4 mg  4 mg Oral Q6H PRN Dellinger, Clerance LavMarianne L, PA-C       Or  . ondansetron (ZOFRAN) injection 4 mg  4 mg Intravenous Q6H PRN Dellinger, Clerance LavMarianne L, PA-C      . polyvinyl alcohol (LIQUIFILM TEARS) 1.4 % ophthalmic solution 1 drop  1 drop Both Eyes QID PRN Dellinger, Clerance LavMarianne L, PA-C      . senna-docusate (Senokot-S) tablet 1 tablet  1 tablet Oral QHS PRN Ihor AustinPyreddy, Pavan, MD         Discharge Medications: Please see discharge summary for a list of discharge medications.  Relevant Imaging Results:  Relevant Lab Results:   Additional Information (SSN: [id number])  Jack French, Darleen CrockerBailey M, LCSW

## 2017-09-12 NOTE — Care Management Important Message (Signed)
Important Message  Patient Details  Name: Jack French MRN: [id number] Date of Birth: 02-06-1924   Medicare Important Message Given:  Yes  Daughter [name] Roman    Gwenette GreetBrenda S Cosme Jacob, [place]RN 09/12/2017, 1:02 PM

## 2017-09-12 NOTE — Progress Notes (Signed)
SLP Cancellation Note  Patient Details Name: Jack French MRN: [id number] DOB: 09-20-1924   Cancelled treatment:       Reason Eval/Treat Not Completed: Medical issues which prohibited therapy;Patient not medically ready;Patient's level of consciousness(chart reviewed; consulted NSG and Palliative Care). Per  Neurologist, pt is lethargic likely multifactorial in setting dehydration/sepsis/metabolic encephalopathy as well as intracranial pathology; pt has a baseline of Dementia. It would be more appropriate for a Cognitive assessment once pt is past the acuity of his medical illness and in his home setting.  Palliative Care is following for overall goals of care d/t decline in medical status. NSG to reconsult ST services if indicated for a swallowing evaluation once pt's medical status improves and he can safely participate. NSG agreed.    Jerilynn SomKatherine Choya Tornow, MS, CCC-SLP Hermie Reagor 09/12/2017, 9:47 AM

## 2017-09-12 NOTE — Progress Notes (Signed)
No charge note.  I have spoken with both of the patients daughters Patricia Pesa([name] and [place]) they are in agreement with full comfort [facility].  Orders placed.  Norvel RichardsMarianne Gergory Biello, PA-C Palliative Medicine Pager: [phone]

## 2017-09-12 NOTE — Consult Note (Signed)
Consultation Note Date: 09/12/2017   Patient Name: Jack French  DOB: 1924/02/25  MRN: [id number]  Age / Sex: 83 y.o., male  PCP: Dimple Casey, MD Referring Physician: Houston Siren, MD  Reason for Consultation: Establishing goals of care  HPI/Patient Profile: 83 y.o. male  with past medical history of PVD s/p bilateral amputation, advanced dementia, CHF (EF 25-30%), pace maker, DM, CKD stage 4 who was admitted on 09/10/2017 with confusion.  Workup revealed subacute left occipital CVA and SDH in the right frontal lobe. The family requested no neurosurgery.  Neurology has evaluated the patient and is in agreement with Palliative care at this point - no further imaging.  The patient is lethargic and severely hypernatremia.  Clinical Assessment and Goals of Care:  I have reviewed medical records including EPIC notes, labs and imaging, received report from the care team, speech therapy and nurse tech assessed the patient and then spoke on the phone with his daughter Patricia Pesa (Guardian) to discuss current status and potentially Hospice House.  I introduced Palliative Medicine as specialized medical care for people living with serious illness. It focuses on providing relief from the symptoms and stress of a serious illness. The goal is to improve quality of life for both the patient and the family.  We discussed a brief life review of the patient. He has 4 children. He lives in a SNF.  [name] is the guardian.  Juliette Alcide also helps care for the patient.  There was discussion of a PEG on the part fo Melinda.  I advised that this was not indicated as patient is at end of life.  As far as functional and nutritional status he is currently NPO as he is too lethargic to eat.  He is a bilateral LE extremity amputee but is total assist for ADLs at this point.   We discussed their current illness and what it means in the  larger context of their on-going co-morbidities.  Natural disease trajectory and expectations at EOL were discussed.  [name] expressed concern about insurance coverage and payment for skilled nursing facility.  I explained to [name] that I did not believe Mr. Spickler was appropriate for SNF any longer.  I feel he is appropriate for hospice house.  We discussed that at EOL force feeding via PEG only adds to discomfort and does not extend life.    [name] asked me to speak with her sister [name] and explain these things to her.   [name] was clear that she does not want her father to suffer.  She is not surprised to hear that he is at EOL.  Hospice and Palliative Care services outpatient were explained and offered.  Questions and concerns were addressed. The family was encouraged to call with questions or concerns.    Primary Decision Maker:  LEGAL GUARDIAN daughter [employer].    SUMMARY OF RECOMMENDATIONS    PEG is contra-indicated.  Patient is near end of life.  Will speak with daughter Juliette Alcide regarding his status and medical recommendation  of no PEG.  PMT recommends Hospice House for this patient.  If family chooses full comfort please saline lock fluids.  PMT is unavailable 12/25 but will check on Mr. Alan Mulderereira on 12/26 if he is still in the hospital.  Code Status/Advance Care Planning:  DNR   Symptom Management:   Will add PRN morphine for dyspnea  Prognosis: less than 2 weeks.  Patient is not eating, very lethargic, CVA, SDH in left frontal lobe, bed bound.    Discharge Planning: Hospice facility is recommended.      Primary Diagnoses: Present on Admission: . Sepsis (HCC) . CVA (cerebral vascular accident) (HCC)   I have reviewed the medical record, interviewed the patient and family, and examined the patient. The following aspects are pertinent.  Past Medical History:  Diagnosis Date  . Chronic kidney disease   . Dementia   . Diabetes mellitus without  complication (HCC)   . Hypertension   . Peripheral vascular disease (HCC)    Social History   Socioeconomic History  . Marital status: Widowed    Spouse name: None  . Number of children: None  . Years of education: None  . Highest education level: None  Social Needs  . Financial resource strain: None  . Food insecurity - worry: None  . Food insecurity - inability: None  . Transportation needs - medical: None  . Transportation needs - non-medical: None  Occupational History  . Occupation: retired  Tobacco Use  . Smoking status: Former Games developermoker  . Smokeless tobacco: Never Used  Substance and Sexual Activity  . Alcohol use: No  . Drug use: No  . Sexual activity: No    Birth control/protection: None  Other Topics Concern  . None  Social History Narrative  . None   History reviewed. No pertinent family history. Scheduled Meds: . aspirin  300 mg Rectal Daily   Or  . aspirin  325 mg Oral Daily  . divalproex  250 mg Oral Daily  . insulin aspart  0-9 Units Subcutaneous Q6H   Continuous Infusions: . ceFAZolin (ANCEF) IVPB - custom Stopped (09/12/17 0410)  . dextrose 100 mL/hr at 09/12/17 0341   PRN Meds:.acetaminophen **OR** acetaminophen, ondansetron **OR** ondansetron (ZOFRAN) IV, senna-docusate Allergies  Allergen Reactions  . Penicillin G Rash   Review of Systems patient not speaking.  Physical Exam  Frail fragile appearing elderly male, rolls eyes up into his head when I gently shake him and speak to him.  He does not speak, but attempts to grunt. CV faint sounds, regular rhythm, slightly brady. Resp rate increases on my exam Abdomen is cachetic Ext - bilateral lower extremity amputee.  Vital Signs: BP 123/71 (BP Location: Right Arm)   Pulse 66   Temp (!) 97.5 F (36.4 C) (Oral)   Resp 18   Ht 6' (1.829 m)   Wt 52.2 kg (115 lb)   SpO2 98%   BMI 15.60 kg/m  Pain Assessment: PAINAD     SpO2: SpO2: 98 % O2 Device:SpO2: 98 % O2 Flow Rate: .O2 Flow Rate  (L/min): 2 L/min  IO: Intake/output summary:   Intake/Output Summary (Last 24 hours) at 09/12/2017 1017 Last data filed at 09/12/2017 0318 Gross per 24 hour  Intake 1943.71 ml  Output -  Net 1943.71 ml    LBM: Last BM Date: 09/12/17 Baseline Weight: Weight: 52.2 kg (115 lb) Most recent weight: Weight: 52.2 kg (115 lb)     Palliative Assessment/Data:  10%     Time In:  8:00 Time Out: 9:00 Time Total: 60 min. Greater than 50%  of this time was spent counseling and coordinating care related to the above assessment and plan.  Signed by: Norvel RichardsMarianne Shameca Landen, PA-C Palliative Medicine Pager: [phone]  Please contact Palliative Medicine Team phone at [phone] for questions and concerns.  For individual provider: See Loretha StaplerAmion

## 2017-09-12 NOTE — Clinical Social Work Note (Signed)
Clinical Social Work Assessment  Patient Details  Name: Jack French MRN: [id number] Date of Birth: Jan 24, 1924  Date of referral:  09/12/17               Reason for consult:  Discharge Planning, End of Life/Hospice                Permission sought to share information with:  Facility Industrial/product designerContact Representative Permission granted to share information::  Yes, Verbal Permission Granted  Name::        Agency::     Relationship::     Contact Information:     Housing/Transportation Living arrangements for the past 2 months:  Skilled Nursing Facility Source of Information:  Adult Children Patient Interpreter Needed:  None Criminal Activity/Legal Involvement Pertinent to Current Situation/Hospitalization:  No - Comment as needed Significant Relationships:  Adult Children Lives with:  Facility Resident Do you feel safe going back to the place where you live?  Yes Need for family participation in patient care:  Yes (Comment)  Care giving concerns:  Patient is a long term care resident at [facility].    Social Worker assessment / plan:  Visual merchandiserClinical Social Worker (CSW) received a residential hospice facility placement consult from Palliative PA. CSW attempted to meet with patient's daughter Jack French who is the decision maker per palliative however she was not at bedside. Jack French's phone #'s on the board in patient's room cell [phone], home [phone]) [phone] and work [phone]) [phone]. CSW contacted Jack French at [employer] where she works. Per daughter patient is a long term care resident at [facility] and has been there for 3 years. Per Jack French patient is on medicaid and Jack French is the DSS medicaid worker that assists with patient's medicaid. Per daughter she would like for patient to spend his last days in comfort and chose the [facility] [place]. CSW made daughter aware that [facility] will notify Jack French at the DSS office that patient is no longer at  [facility].  CSW contacted [facility] referral intake and faxed referral. Per Dois DavenportSandra with [facility] there is a waiting list for a hospice home bed and patient's referral will be reviewed. CSW will continue to follow and assist as needed.    Employment status:  Retired Health and safety inspectornsurance information:  [place] [employer] Medicare PT Recommendations:  Not assessed at this time Information / Referral to community resources:  Other (Comment Required)([facility] Placement. )  Patient/Family's Response to care:  Patient's daughter chose [facility].   Patient/Family's Understanding of and Emotional Response to Diagnosis, Current Treatment, and Prognosis:  Patient's daughter was very pleasant and thanked CSW for assistance.   Emotional Assessment Appearance:  Appears stated age Attitude/Demeanor/Rapport:  Unable to Assess Affect (typically observed):  Unable to Assess Orientation:  Oriented to Self, Fluctuating Orientation (Suspected and/or reported Sundowners) Alcohol / Substance use:  Not Applicable Psych involvement (Current and /or in the community):  No (Comment)  Discharge Needs  Concerns to be addressed:  Discharge Planning Concerns Readmission within the last 30 days:  No Current discharge risk:  Dependent with Mobility, Cognitively Impaired, Chronically ill, Terminally ill Barriers to Discharge:  Continued Medical Work up   [employer], Jack CrockerBailey M, LCSW 09/12/2017, 1:37 PM

## 2017-09-13 NOTE — Progress Notes (Signed)
[facility] [place] [facility]   PATIENT NAME: Jack French    MR#:  [id number]  DATE OF BIRTH:  05/10/1924  SUBJECTIVE:   Appears comfortable. Awaiting Hospice Home Placement. Under comfort care.   REVIEW OF SYSTEMS:    Review of Systems  Unable to perform ROS: Mental acuity    DRUG ALLERGIES:   Allergies  Allergen Reactions  . Penicillin G Rash    VITALS:  Blood pressure (!) 71/22, pulse 67, temperature 97.6 F (36.4 C), temperature source Oral, resp. rate 14, height 6' (1.829 m), weight 52.2 kg (115 lb), SpO2 97 %.  PHYSICAL EXAMINATION:   Physical Exam  GENERAL:  83 y.o.-year-old patient lying in bed lethargic, Encephalopathic in NAD.  EYES:No scleral icterus.  HEENT: Head atraumatic, normocephalic. Oropharynx and nasopharynx clear.  Dry Oral Mucosa.  NECK:  Supple, no jugular venous distention. No thyroid enlargement, no tenderness.  LUNGS: Poor Resp. effort, no wheezing, rales, rhonchi. No use of accessory muscles of respiration.  CARDIOVASCULAR: S1, S2 normal. No murmurs, rubs, or gallops.  ABDOMEN: Soft, nontender, nondistended. Bowel sounds present. No organomegaly or mass.  EXTREMITIES: No cyanosis, clubbing or edema b/l.  Left AKA NEUROLOGIC: Globally weak/Sedated & Encephalopathic. Marland Kitchen.   PSYCHIATRIC:   Globally weak/Sedated & Encephalopathic. SKIN: No obvious rash, lesion, or ulcer.    LABORATORY PANEL:   CBC Recent Labs  Lab 09/11/17 0514  WBC 8.0  HGB 10.8*  HCT 33.8*  PLT 165   ------------------------------------------------------------------------------------------------------------------  Chemistries  Recent Labs  Lab 09/10/17 0135 09/10/17 0545 09/11/17 0514 09/12/17 0516  NA 149*  --  152*  --   K 4.1  --  3.6  --   CL 117*  --  125*  --   CO2 23  --  19*  --   GLUCOSE 109*  --  104*  --   BUN 70*  --  55*  --   CREATININE 2.68*  --  1.75* 1.41*  CALCIUM 8.2*  --  7.6*  --   MG  --  2.2  --   --   AST  34  --   --   --   ALT 22  --   --   --   ALKPHOS 63  --   --   --   BILITOT 0.9  --   --   --    ------------------------------------------------------------------------------------------------------------------  Cardiac Enzymes Recent Labs  Lab 09/10/17 1702  TROPONINI 0.10*   ------------------------------------------------------------------------------------------------------------------  RADIOLOGY:  No results found.   ASSESSMENT AND PLAN:   83 year old male with past medical history of advanced dementia, hypertension, diabetes, peripheral vascular disease status post left AKA, chronic kidney disease stage IV who presents to the hospital due to altered mental status.  1. Altered mental status-secondary to underlying dementia combined with suspected CVA and also a subacute subdural hematoma. -Patient's CT head on admission showed a subacute occipital CVA and also a right frontal subacute subdural hematoma.  2. Right-sided subdural hematoma-this was noted incidentally on the CT scan of head on admission. Patient's family does not want to pursue aggressive care or any neurosurgical intervention. 3. Hypernatremia -  Due to severe dehydration and poor PO intake.  4. diabetes type 2 without complication 5. Aspiration pneumonia 6. Dementia with behavioral disturbance 7. CKD Stage IV  Patient's prognosis is very poor given his advanced dementia and now with CVA and also a subacute subdural hematoma. Patient had not improved despite supportive therapy over the  past few days, therefore, Palliative care consult was obtained to discuss goals of care with patient's daughters. Patient is now comfort care only and is awaiting hospice program placement. -Continue morphine, Ativan as needed. Continue glycopyrrolate for secretions.   All the records are reviewed and case discussed with Care Management/Social Worker. Management plans discussed with the patient, family and they are in  agreement.  CODE STATUS: DO NOT RESUSCITATE  DVT Prophylaxis: Teds and SCDs  TOTAL TIME TAKING CARE OF THIS PATIENT: 20 minutes.   POSSIBLE D/C IN 1-2 DAYS, DEPENDING ON CLINICAL CONDITION.   Houston SirenSAINANI,Sadye Kiernan J M.D on 09/13/2017 at 11:22 AM  Between 7am to 6pm - Pager - [phone]  After 6pm go to [email or web address] - Social research officer, governmentpassword EPAS [facility]  [facility] [place] [facility]  Office  [phone]  CC: Primary care physician; Dimple CaseySmith, Sean A, MD

## 2017-09-14 LAB — CULTURE, BLOOD (ROUTINE X 2): Special Requests: ADEQUATE

## 2017-09-14 LAB — GLUCOSE, CAPILLARY: Glucose-Capillary: 76 mg/dL (ref 65–99)

## 2017-09-14 MED ORDER — LORAZEPAM 2 MG/ML PO CONC: 1.0000 mg | ORAL | 0 refills | Status: AC | PRN

## 2017-09-14 MED ORDER — MORPHINE SULFATE (CONCENTRATE) 10 MG/0.5ML PO SOLN: 5.0000 mg | ORAL | Status: AC | PRN

## 2017-09-14 MED ORDER — ACETAMINOPHEN 650 MG RE SUPP: 650.0000 mg | Freq: Four times a day (QID) | RECTAL | 0 refills | Status: AC | PRN

## 2017-09-14 MED ORDER — GLYCOPYRROLATE 0.2 MG/ML IJ SOLN: 0.2000 mg | INTRAMUSCULAR | Status: AC | PRN

## 2017-09-14 MED ORDER — HALOPERIDOL LACTATE 2 MG/ML PO CONC: 0.6000 mg | ORAL | 0 refills | Status: AC | PRN

## 2017-09-14 NOTE — Progress Notes (Signed)
[facility] patient can come to the hospice home today. Clinical Child psychotherapistocial Worker (CSW) prepared D/C packet including DNR form. MD and RN aware of above. Please reconsult if future social work needs arise. CSW signing off.   [employer]Bailey Leila Schuff, LCSW [phone]

## 2017-09-14 NOTE — Discharge Summary (Signed)
[facility] - [place] at [facility]   PATIENT NAME: Jack French    MR#:  [id number]  DATE OF BIRTH:  [birth year]  DATE OF ADMISSION:  09/10/2017 ADMITTING PHYSICIAN: Ihor Austin, MD  DATE OF DISCHARGE: 09/14/2017  PRIMARY CARE PHYSICIAN: Dimple Casey, MD    ADMISSION DIAGNOSIS:  Dehydration [E86.0] SDH (subdural hematoma) (HCC) [S06.5X9A] CVA (cerebral vascular accident) (HCC) [I63.9] Elevated troponin [R74.8] AKI (acute kidney injury) (HCC) [N17.9] Altered mental status, unspecified altered mental status type [R41.82] Cerebrovascular accident (CVA), unspecified mechanism (HCC) [I63.9]  DISCHARGE DIAGNOSIS:  Active Problems:   Sepsis (HCC)   CVA (cerebral vascular accident) (HCC)   Pressure injury of skin   Protein-calorie malnutrition, severe   Altered mental status   SDH (subdural hematoma) (HCC)   Vascular dementia without behavioral disturbance   Palliative care encounter   SECONDARY DIAGNOSIS:   Past Medical History:  Diagnosis Date  . Chronic kidney disease   . Dementia   . Diabetes mellitus without complication (HCC)   . Hypertension   . Peripheral vascular disease (HCC)     HOSPITAL COURSE:   1.  Subdural hematoma and subacute occipital CVA, acute encephalopathy with underlying dementia.  Patient's tries to answer couple questions but difficult to understand.  Mental status has not recovered.  Patient will be transferred to the hospice home. 2.  Hypernatremia secondary to severe dehydration and poor oral intake 3.  Type 2 diabetes mellitus without complication 4.  Aspiration pneumonia 5.  Dementia with behavioral disturbance 6.  Acute kidney injury on chronic kidney disease stage III 7.  Borderline troponin likely demand ischemia 8.  History of right carotid stenosis on prior MR a of the neck back in 2009 9.  Peripheral vascular disease with history of left leg amputation 10.  Blood culture likely contamination  DISCHARGE  CONDITIONS:   Guarded  CONSULTS OBTAINED:  Treatment Team:  Pauletta Browns, MD  DRUG ALLERGIES:   Allergies  Allergen Reactions  . Penicillin G Rash    DISCHARGE MEDICATIONS:   Allergies as of 09/14/2017      Reactions   Penicillin G Rash      Medication List    STOP taking these medications   bacitracin ointment   bisacodyl 5 MG EC tablet Commonly known as:  DULCOLAX   carvedilol 12.5 MG tablet Commonly known as:  COREG   divalproex 125 MG capsule Commonly known as:  DEPAKOTE SPRINKLE   divalproex 250 MG 24 hr tablet Commonly known as:  DEPAKOTE ER   ertapenem IVPB Commonly known as:  INVANZ   senna-docusate 8.6-50 MG tablet Commonly known as:  Senokot-S     TAKE these medications   acetaminophen 650 MG suppository Commonly known as:  TYLENOL Place 1 suppository (650 mg total) rectally every 6 (six) hours as needed for mild pain (or Fever >/= 101).   glycopyrrolate 0.2 MG/ML injection Commonly known as:  ROBINUL Inject 1 mL (0.2 mg total) into the skin every 4 (four) hours as needed (excessive secretions).   haloperidol 2 MG/ML solution Commonly known as:  HALDOL Place 0.3 mLs (0.6 mg total) under the tongue every 4 (four) hours as needed for agitation (or delirium).   LORazepam 2 MG/ML concentrated solution Commonly known as:  ATIVAN Place 0.5 mLs (1 mg total) under the tongue every 4 (four) hours as needed for anxiety.   morphine CONCENTRATE 10 MG/0.5ML Soln concentrated solution Place 0.25 mLs (5 mg total) under the tongue every 2 (two) hours  as needed for moderate pain or severe pain (or dyspnea).        DISCHARGE INSTRUCTIONS:   Follow-up with hospice home team  If you experience worsening of your admission symptoms, develop shortness of breath, life threatening emergency, suicidal or homicidal thoughts you must seek medical attention immediately by calling 911 or calling your MD immediately  if symptoms less severe.  You Must read  complete instructions/literature along with all the possible adverse reactions/side effects for all the Medicines you take and that have been prescribed to you. Take any new Medicines after you have completely understood and accept all the possible adverse reactions/side effects.   Please note  You were cared for by a hospitalist during your hospital stay. If you have any questions about your discharge medications or the care you received while you were in the hospital after you are discharged, you can call the unit and asked to speak with the hospitalist on call if the hospitalist that took care of you is not available. Once you are discharged, your primary care physician will handle any further medical issues. Please note that NO REFILLS for any discharge medications will be authorized once you are discharged, as it is imperative that you return to your primary care physician (or establish a relationship with a primary care physician if you do not have one) for your aftercare needs so that they can reassess your need for medications and monitor your lab values.    Today   CHIEF COMPLAINT:   Chief Complaint  Patient presents with  . Altered Mental Status    HISTORY OF PRESENT ILLNESS:  Jack French  is a 83 y.o. male came in with altered mental status   VITAL SIGNS:  Blood pressure (!) 79/41, pulse (!) 151, temperature 98 F (36.7 C), temperature source Oral, resp. rate 16, height 6' (1.829 m), weight 52.2 kg (115 lb), SpO2 96 %.   PHYSICAL EXAMINATION:  GENERAL:  83 y.o.-year-old patient lying in the bed with no acute distress.  EYES: Pupils equal, round, reactive to light and accommodation. No scleral icterus. HEENT: Head atraumatic, normocephalic. Oropharynx and nasopharynx clear.  NECK:  Supple, no jugular venous distention. No thyroid enlargement, no tenderness.  LUNGS: Normal breath sounds bilaterally, no wheezing, rales,rhonchi or crepitation. No use of accessory muscles of  respiration.  CARDIOVASCULAR: S1, S2 tachycardia. No murmurs, rubs, or gallops.  ABDOMEN: Soft, non-tender, non-distended. Bowel sounds present. No organomegaly or mass.  EXTREMITIES: No pedal edema, cyanosis, or clubbing.  NEUROLOGIC: Patient moves his arms on his own PSYCHIATRIC: The patient is alert.  SKIN: No obvious rash, lesion, or ulcer anteriorly  DATA REVIEW:   CBC Recent Labs  Lab 09/11/17 0514  WBC 8.0  HGB 10.8*  HCT 33.8*  PLT 165    Chemistries  Recent Labs  Lab 09/10/17 0135 09/10/17 0545 09/11/17 0514 09/12/17 0516  NA 149*  --  152*  --   K 4.1  --  3.6  --   CL 117*  --  125*  --   CO2 23  --  19*  --   GLUCOSE 109*  --  104*  --   BUN 70*  --  55*  --   CREATININE 2.68*  --  1.75* 1.41*  CALCIUM 8.2*  --  7.6*  --   MG  --  2.2  --   --   AST 34  --   --   --   ALT 22  --   --   --  ALKPHOS 63  --   --   --   BILITOT 0.9  --   --   --     Cardiac Enzymes Recent Labs  Lab 09/10/17 1702  TROPONINI 0.10*    Microbiology Results  Results for orders placed or performed during the hospital encounter of 09/10/17  Culture, blood (routine x 2)     Status: Abnormal   Collection Time: 09/10/17  3:07 AM  Result Value Ref Range Status   Specimen Description   Final    BLOOD RIGHT ANTECUBITAL Performed at [facility], [address] [place], [place] [address]    Special Requests   Final    BOTTLES DRAWN AEROBIC AND ANAEROBIC Blood Culture adequate volume Performed at [facility], [address] [place], [place] [address]    Culture  Setup Time   Final    GRAM POSITIVE COCCI AEROBIC BOTTLE ONLY CRITICAL RESULT CALLED TO, READ BACK BY AND VERIFIED WITH: DAVID BESANTI ON 09/11/17 AT 0336 JAG    Culture (A)  Final    STAPHYLOCOCCUS SPECIES (COAGULASE NEGATIVE) THE SIGNIFICANCE OF ISOLATING THIS ORGANISM FROM A SINGLE SET OF BLOOD CULTURES WHEN MULTIPLE SETS ARE DRAWN IS UNCERTAIN. PLEASE NOTIFY THE MICROBIOLOGY DEPARTMENT  WITHIN ONE WEEK IF SPECIATION AND SENSITIVITIES ARE REQUIRED. Performed at [facility], [address] [place], [place] [address]    Report Status 09/14/2017 FINAL  Final  Culture, blood (routine x 2)     Status: None (Preliminary result)   Collection Time: 09/10/17  3:07 AM  Result Value Ref Range Status   Specimen Description BLOOD LEFT ANTECUBITAL  Final   Special Requests   Final    BOTTLES DRAWN AEROBIC AND ANAEROBIC Blood Culture adequate volume   Culture   Final    NO GROWTH 4 DAYS Performed at [facility], [address] [place], [place] [address]    Report Status PENDING  Incomplete  Blood Culture ID Panel (Reflexed)     Status: Abnormal   Collection Time: 09/10/17  3:07 AM  Result Value Ref Range Status   Enterococcus species NOT DETECTED NOT DETECTED Final   Listeria monocytogenes NOT DETECTED NOT DETECTED Final   Staphylococcus species DETECTED (A) NOT DETECTED Final    Comment: Methicillin (oxacillin) susceptible coagulase negative staphylococcus. Possible blood culture contaminant (unless isolated from more than one blood culture draw or clinical case suggests pathogenicity). No antibiotic treatment is indicated for blood  culture contaminants. CRITICAL RESULT CALLED TO, READ BACK BY AND VERIFIED WITH: DAVID BASENTI ON 09/11/17 AT 0336 JAG    Staphylococcus aureus NOT DETECTED NOT DETECTED Final   Methicillin resistance NOT DETECTED NOT DETECTED Final   Streptococcus species NOT DETECTED NOT DETECTED Final   Streptococcus agalactiae NOT DETECTED NOT DETECTED Final   Streptococcus pneumoniae NOT DETECTED NOT DETECTED Final   Streptococcus pyogenes NOT DETECTED NOT DETECTED Final   Acinetobacter baumannii NOT DETECTED NOT DETECTED Final   Enterobacteriaceae species NOT DETECTED NOT DETECTED Final   Enterobacter cloacae complex NOT DETECTED NOT DETECTED Final   Escherichia coli NOT DETECTED NOT DETECTED Final   Klebsiella oxytoca NOT DETECTED NOT  DETECTED Final   Klebsiella pneumoniae NOT DETECTED NOT DETECTED Final   Proteus species NOT DETECTED NOT DETECTED Final   Serratia marcescens NOT DETECTED NOT DETECTED Final   Haemophilus influenzae NOT DETECTED NOT DETECTED Final   Neisseria meningitidis NOT DETECTED NOT DETECTED Final   Pseudomonas aeruginosa NOT DETECTED NOT DETECTED Final  Candida albicans NOT DETECTED NOT DETECTED Final   Candida glabrata NOT DETECTED NOT DETECTED Final   Candida krusei NOT DETECTED NOT DETECTED Final   Candida parapsilosis NOT DETECTED NOT DETECTED Final   Candida tropicalis NOT DETECTED NOT DETECTED Final    Comment: Performed at [facility], [address] [place], [place] [address]  MRSA PCR Screening     Status: None   Collection Time: 09/10/17  5:02 AM  Result Value Ref Range Status   MRSA by PCR NEGATIVE NEGATIVE Final    Comment:        The GeneXpert MRSA Assay (FDA approved for NASAL specimens only), is one component of a comprehensive MRSA colonization surveillance program. It is not intended to diagnose MRSA infection nor to guide or monitor treatment for MRSA infections. Performed at [facility], [address] [place], [place] [address]        Management plans discussed with hospice liason that spoke with family and they are in agreement.  CODE STATUS:     Code Status Orders  (From admission, onward)        Start     Ordered   09/12/17 1147  Do not attempt resuscitation (DNR)  Continuous    Question Answer Comment  In the event of cardiac or respiratory ARREST Do not call a "code blue"   In the event of cardiac or respiratory ARREST Do not perform Intubation, CPR, defibrillation or ACLS   In the event of cardiac or respiratory ARREST Use medication by any route, position, wound care, and other measures to relive pain and suffering. May use oxygen, suction and manual treatment of airway obstruction as needed for comfort.   Comments Per  patient's daughter [facility] Roman; verified by RN Hermenia BersJennifer Ellington      09/12/17 1148    Code Status History    Date Active Date Inactive Code Status Order ID Comments User Context   09/10/2017 03:19 09/12/2017 11:48 DNR [id number]  Ihor AustinPyreddy, Pavan, MD ED   09/10/2017 03:08 09/10/2017 03:19 DNR [id number]  Irean HongSung, Jade J, MD ED      TOTAL TIME TAKING CARE OF THIS PATIENT: 35 minutes.    Alford Highlandichard Janisha Bueso M.D on 09/14/2017 at 12:42 PM  Between 7am to 6pm - Pager - [phone]  After 6pm go to [email or web address] - password [email or web address]  [facility] Office  [phone]  CC: Primary care physician; Dimple CaseySmith, Sean A, MD

## 2017-09-14 NOTE — Progress Notes (Signed)
New hospice home referral received on 12/24. Bed available now.Pateint seen lying in bed, breathing changes when spoken to, no verbal response noted. Patient required IV morphine and lorazepam yesterday for symptom management. Writer spoke with patient's daughter Kathrin Greathouse, she remains in agreement for transfer. Writer met with [name] at her work place, questions answered, consents signed. Plan is for discharge to the hospice home today. Hospital care team all updated and aware. Report called to the hospice home, EMS notified for transport.  Flo Shanks RN, BSN, [facility] [place], hospital liaison [phone]

## 2017-09-14 NOTE — Progress Notes (Signed)
Nutrition Brief Note  Chart reviewed. Patient now transitioning to comfort care.   No further nutrition interventions warranted at this time.  Please consult RD as needed.   Gemma Ruan, MS, RD, LDN Office: [phone] Pager: [phone] After Hours/Weekend Pager: [phone]    

## 2017-09-14 NOTE — Progress Notes (Signed)
No charge note.  Patient examined.  He is comfortable.  Non - responsive.  Breathing increases at times.  Spoke with Scientist, forensicN techs.  He is having BM and urinating.  Occasionally awake.  Does not appear to be in any distress.  Will continue to check on him while he is in house.  Norvel RichardsMarianne Osbaldo Mark, PA-C Palliative Medicine Pager: [phone]

## 2017-09-15 ENCOUNTER — Ambulatory Visit: Payer: Medicare Other | Admitting: Surgery

## 2017-09-15 LAB — CULTURE, BLOOD (ROUTINE X 2)
Culture: NO GROWTH
Special Requests: ADEQUATE

## 2017-10-21 DEATH — deceased

## 2018-09-19 IMAGING — US US CAROTID DUPLEX BILAT
1 series · 13 of 24 positions shown · non-contrast
Comparison: 10/23/2007

CLINICAL DATA: Altered mental status.  CVA.

EXAM:
BILATERAL CAROTID DUPLEX ULTRASOUND
TECHNIQUE: Gray scale imaging, color Doppler and duplex ultrasound were
performed of bilateral carotid and vertebral arteries in the neck.

[Series 1: us carotid duplex bilat · 0.06mm/px · 13 of 66 slices shown]
[im 1/66]
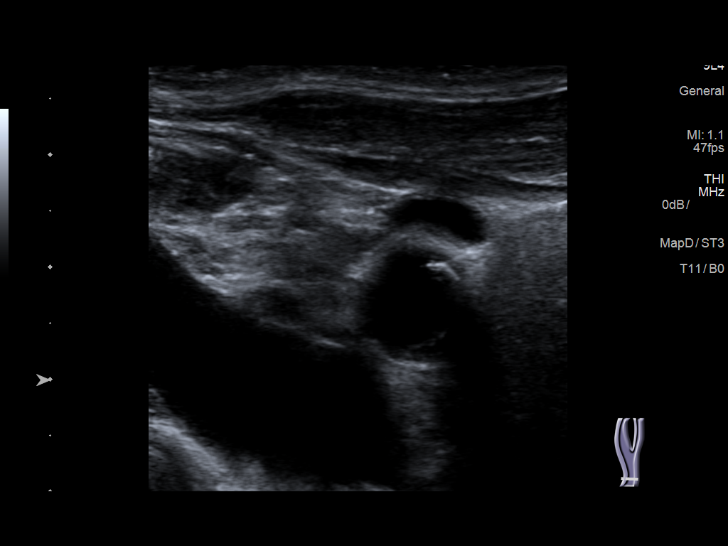
[im 6/66]
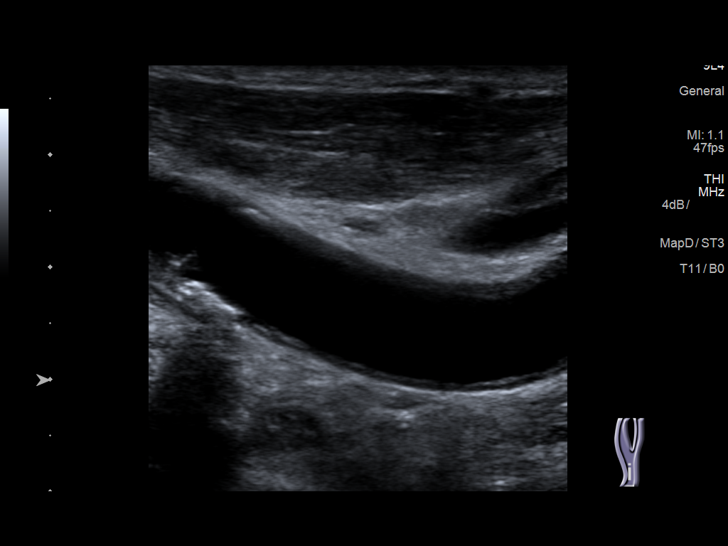
[im 12/66]
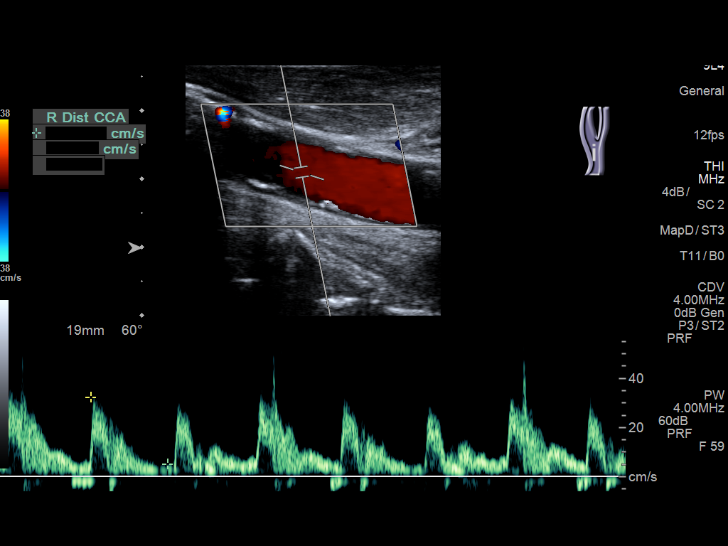
[im 17/66]
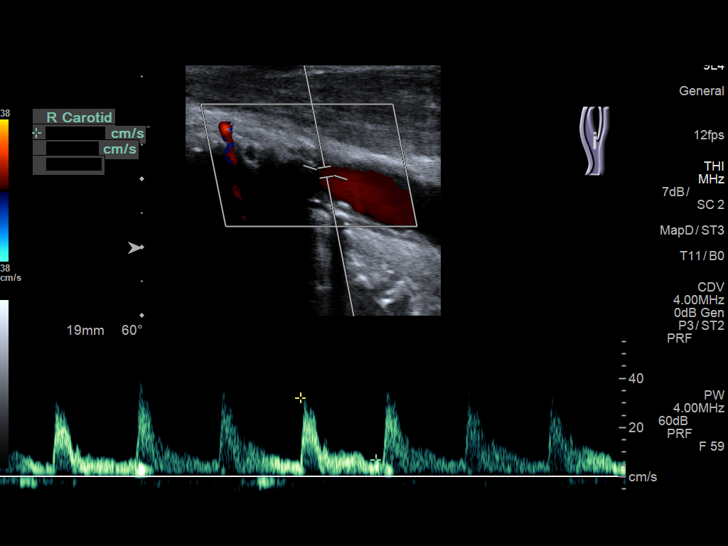
[im 23/66]
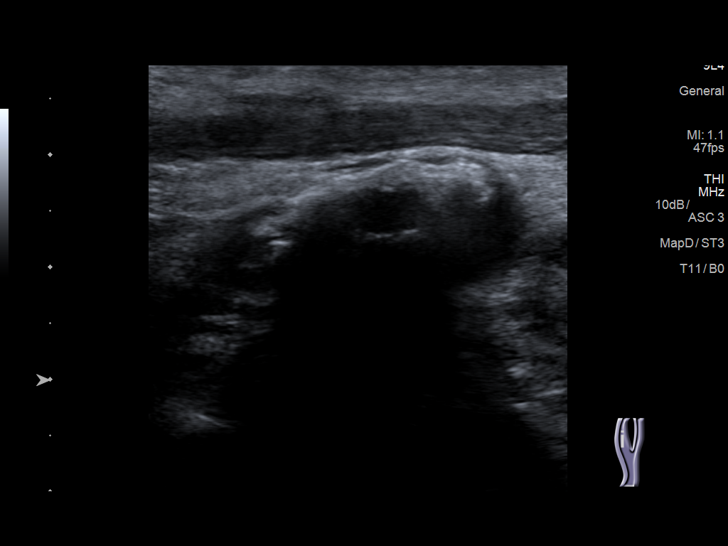
[im 29/66]
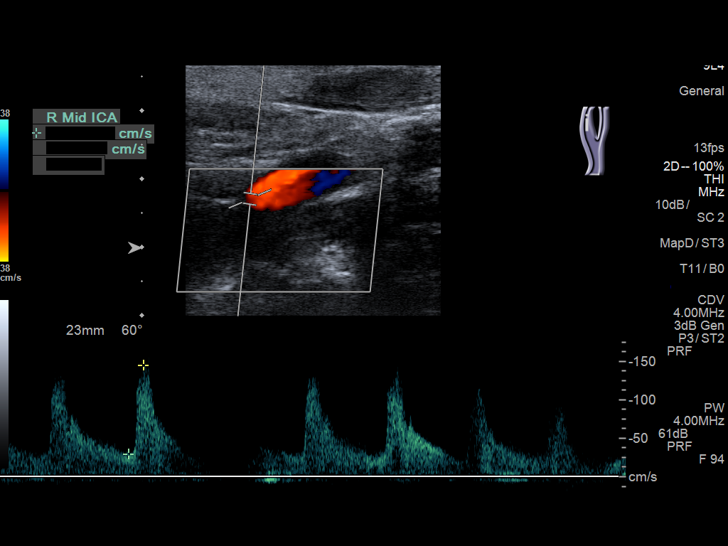
[im 34/66]
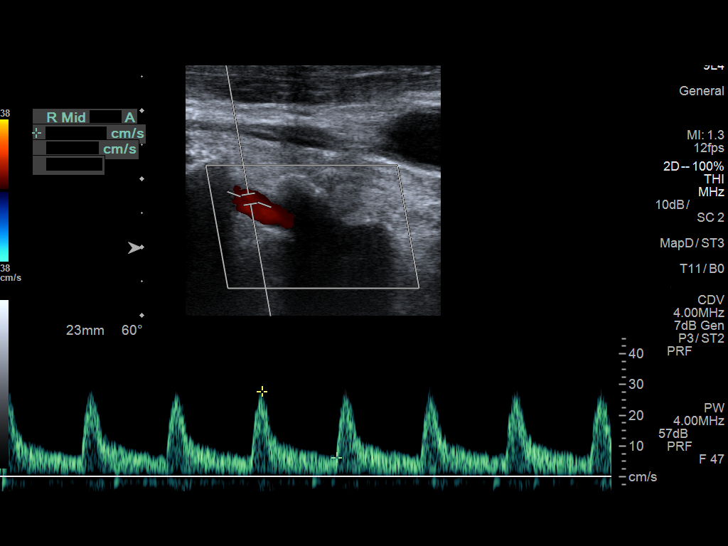
[im 37/66]
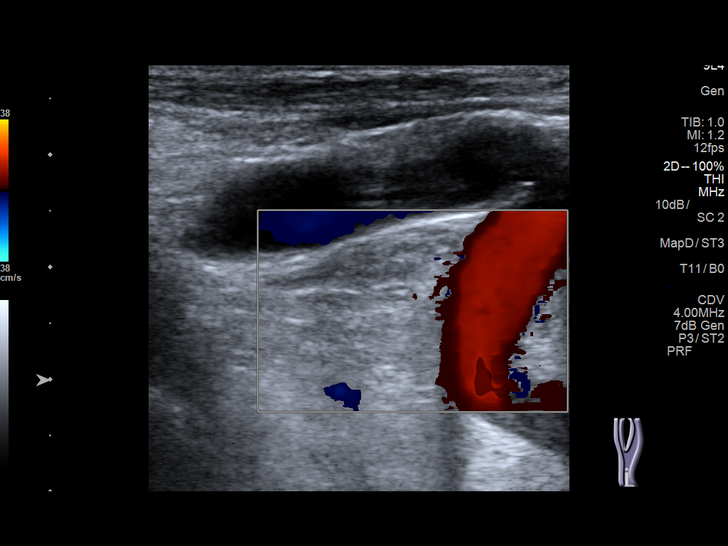
[im 43/66]
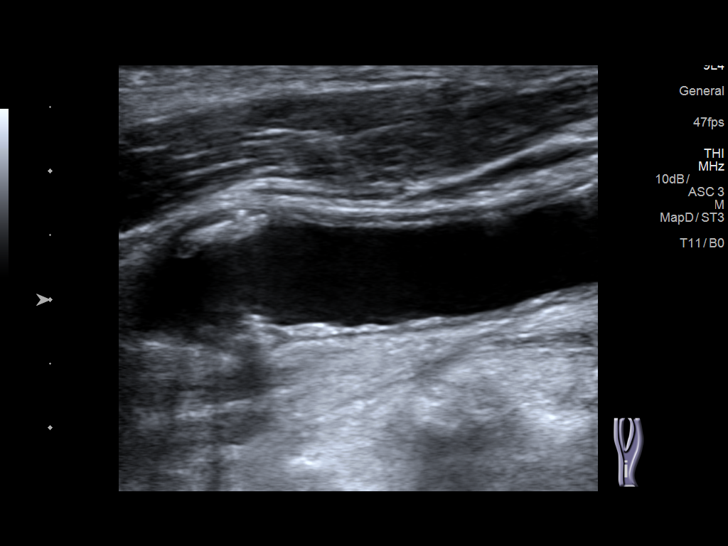
[im 49/66]
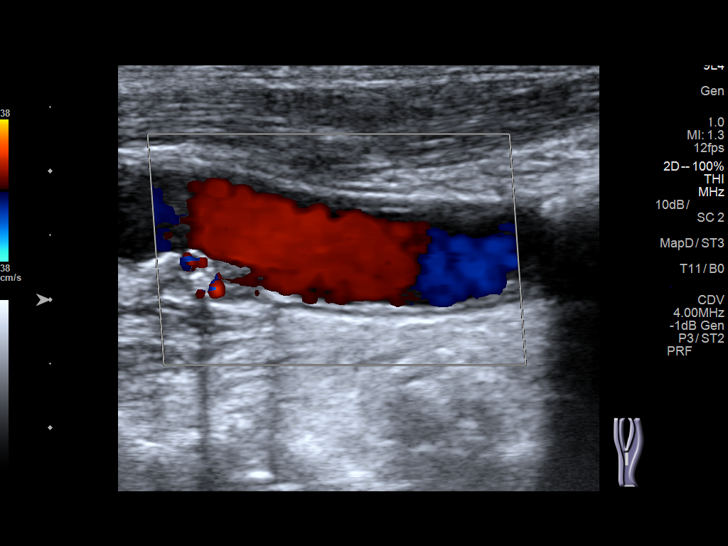
[im 54/66]
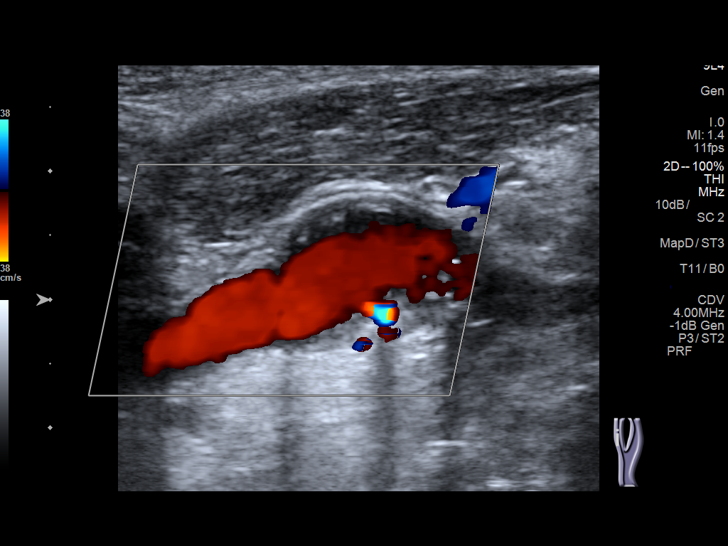
[im 60/66]
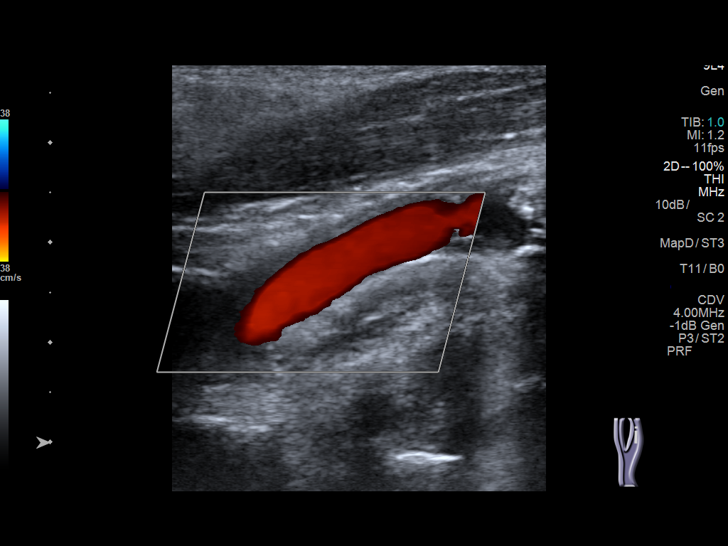
[im 66/66]
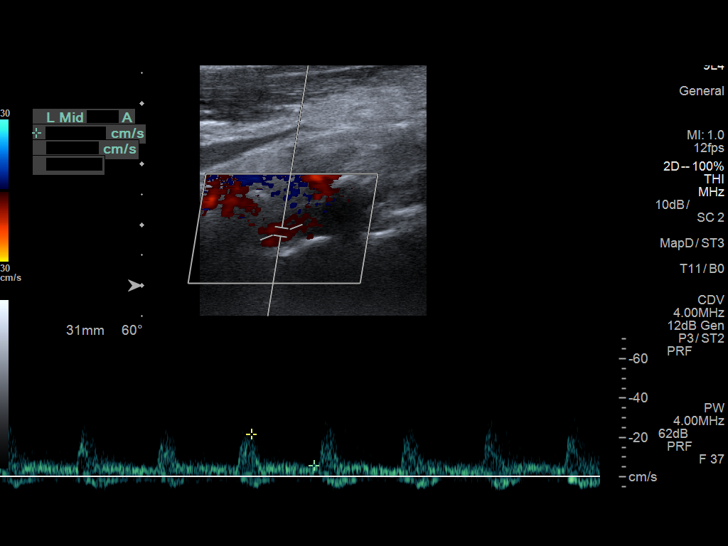

[13 of 24 positions shown; findings below may reference images not displayed]

FINDINGS: Criteria: Quantification of carotid stenosis is based on velocity
parameters that correlate the residual internal carotid diameter
with NASCET-based stenosis levels, using the diameter of the distal
internal carotid lumen as the denominator for stenosis measurement.

The following velocity measurements were obtained:

RIGHT

ICA:  170 cm/sec

CCA:  55 cm/sec

SYSTOLIC ICA/CCA RATIO:

DIASTOLIC ICA/CCA RATIO:

ECA:  109 cm/sec

LEFT

ICA:  65 cm/sec

CCA:  105 cm/sec

SYSTOLIC ICA/CCA RATIO:

DIASTOLIC ICA/CCA RATIO:

ECA:  51 cm/sec

RIGHT CAROTID ARTERY: The plaque in the right common carotid artery.
Large amount of calcified plaque with shadowing at the right carotid
bulb. Limited evaluation of right carotid bulb due to the posterior
acoustic shadowing. External carotid artery is patent with normal
waveform. Peak systolic velocity in the proximal internal carotid
artery is 170 cm/sec.

RIGHT VERTEBRAL ARTERY: Antegrade flow and normal waveform in the
right vertebral artery.

LEFT CAROTID ARTERY: Left common carotid artery is very torturous.
Echogenic plaque at the left carotid bulb. External carotid artery
is patent with normal waveform. Echogenic plaque in the proximal
internal carotid artery. Normal waveforms and velocities in the
internal carotid artery.

LEFT VERTEBRAL ARTERY: Antegrade flow and normal waveform in the
left vertebral artery.
IMPRESSION: Moderate atherosclerotic disease in the carotid arteries, right side
greater than left.

Estimated degree of stenosis in right internal carotid artery is
50-69%. Incomplete evaluation of the right carotid bulb due to
extensive calcified plaque that causes posterior acoustic shadowing.

Estimated degree of stenosis in left internal carotid artery is less
than 50%.

Patent vertebral arteries with antegrade flow.

## 2019-06-26 IMAGING — CT CT HEAD W/O CM
4 series · 16 of 30 positions shown, 17 images · non-contrast
Comparison: CT of the head performed 05/08/2017

CLINICAL DATA: Acute onset of altered mental status.

EXAM:
CT HEAD WITHOUT CONTRAST
TECHNIQUE: Contiguous axial images were obtained from the base of the skull
through the vertex without intravenous contrast.

[Series 5: head wo recons · axial · 0.34mm/px · z∈[-25,+24]mm · 2 of 30 slices shown, 3 images]
[im 10/30  brain]
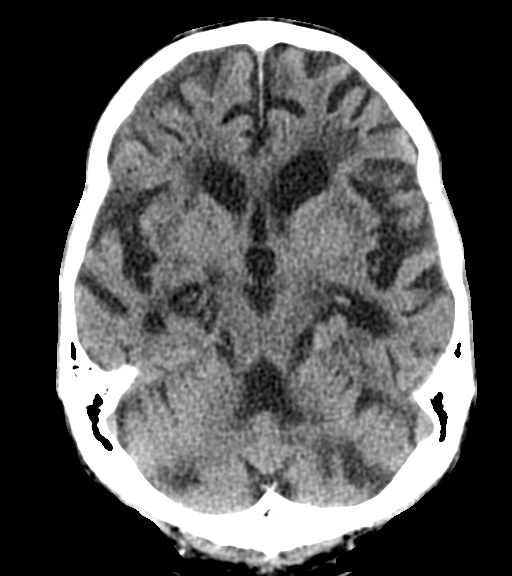
[im 10/30  bone]
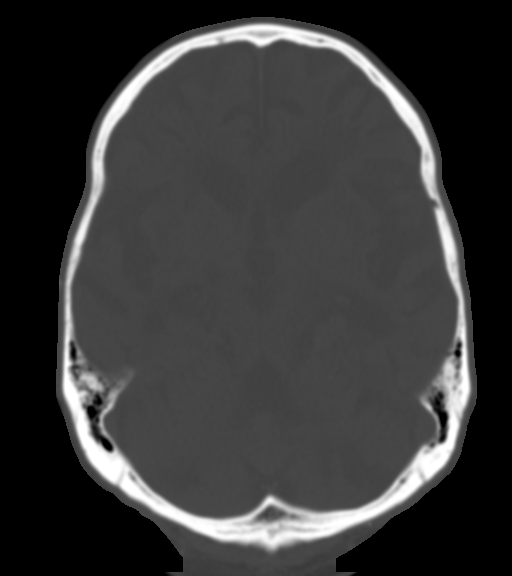
[im 20/30  brain]
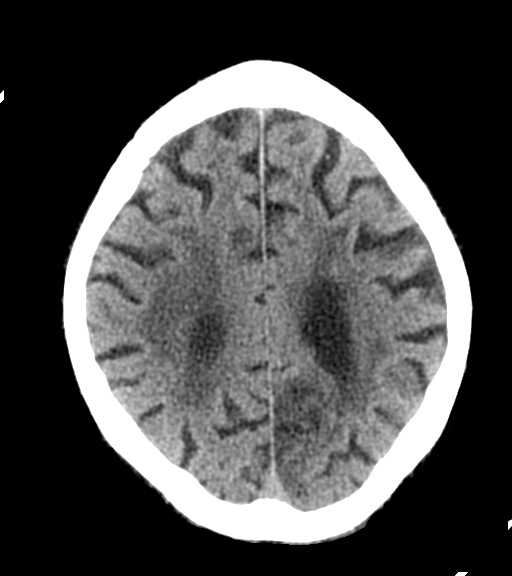

[Series 6: head bone · axial · 0.34mm/px · z∈[-60,+64]mm · 8 of 80 slices shown]
[im 8/80  bone]
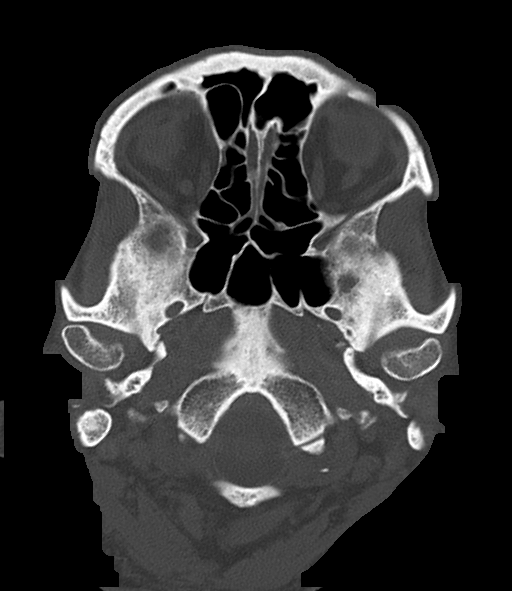
[im 16/80  bone]
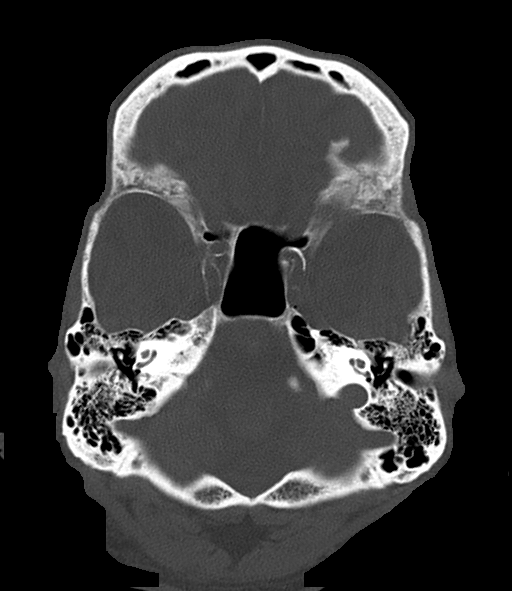
[im 24/80  bone]
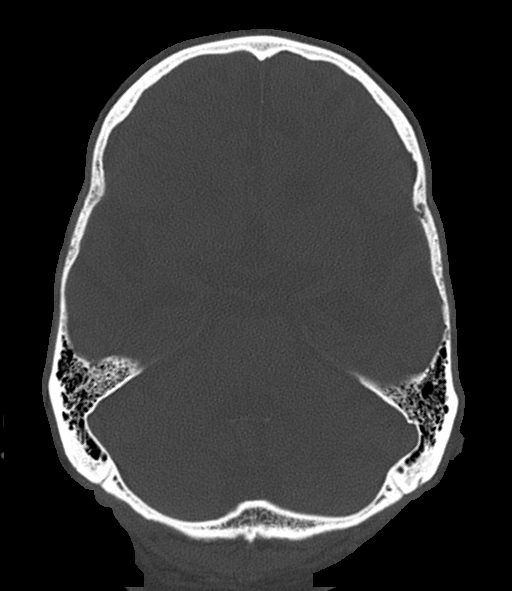
[im 32/80  bone]
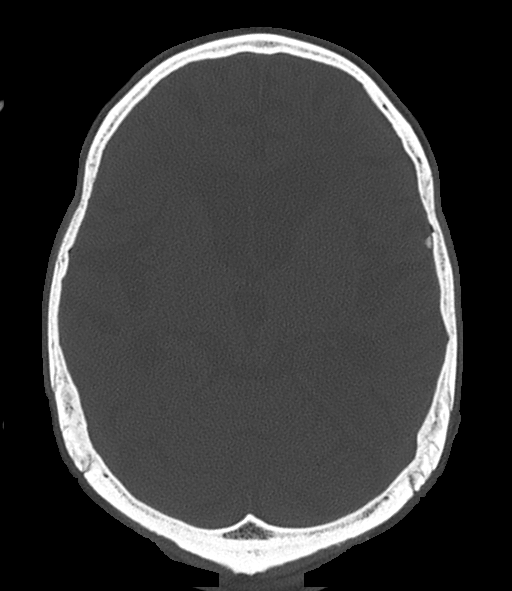
[im 48/80  bone]
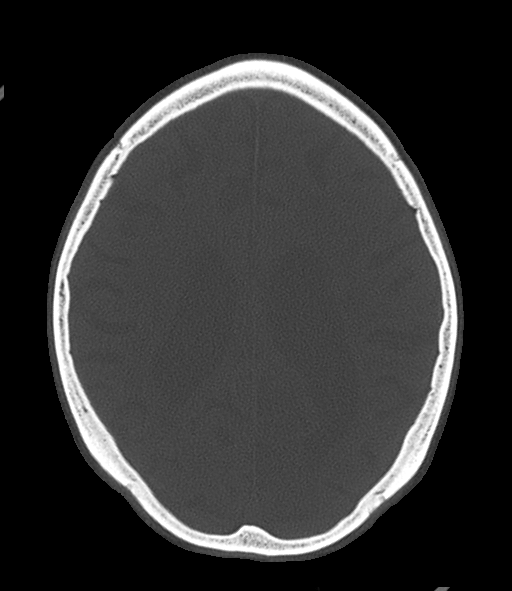
[im 56/80  bone]
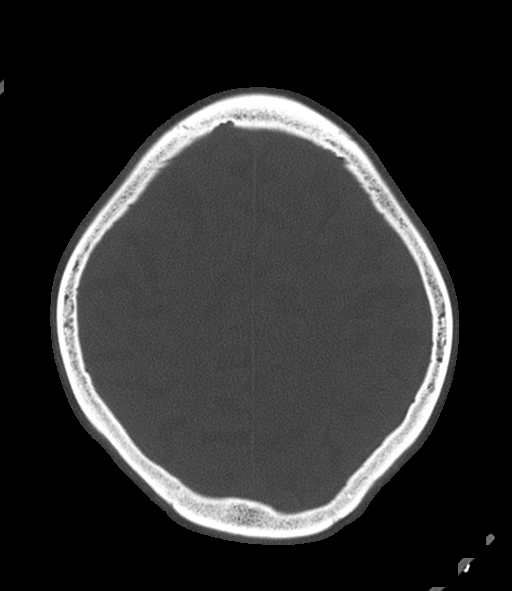
[im 64/80  bone]
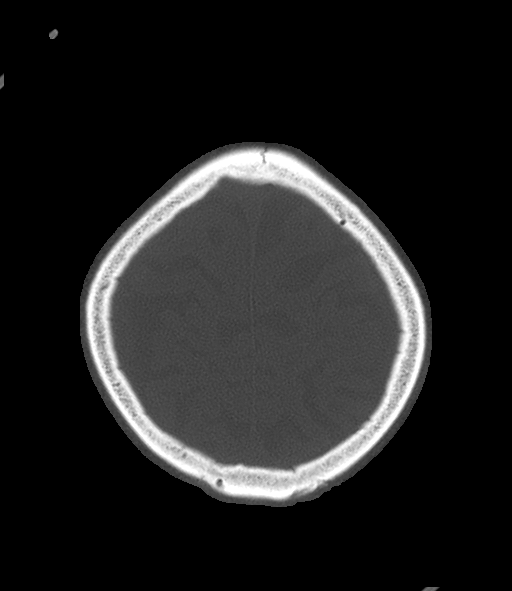
[im 72/80  bone]
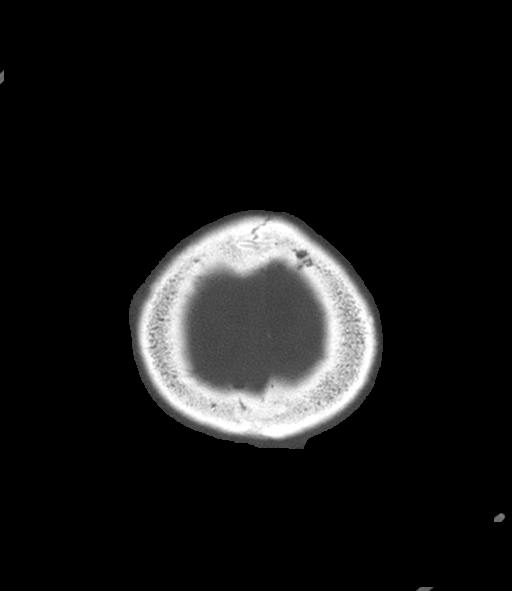

[Series 602: head without--- · axial · non-contrast · 0.40mm/px · z∈[-38,+12]mm · 2 of 32 slices shown]
[im 11/32  brain]
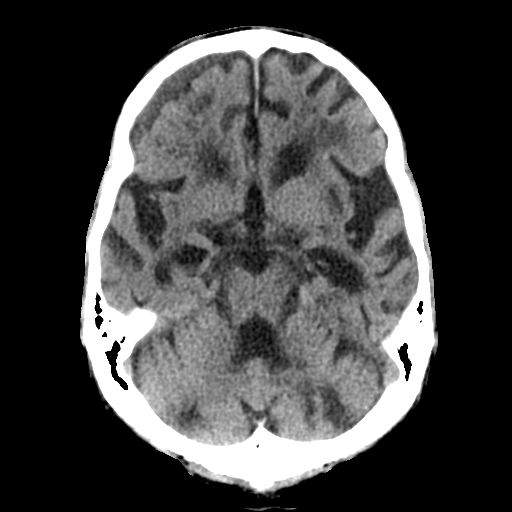
[im 21/32  brain]
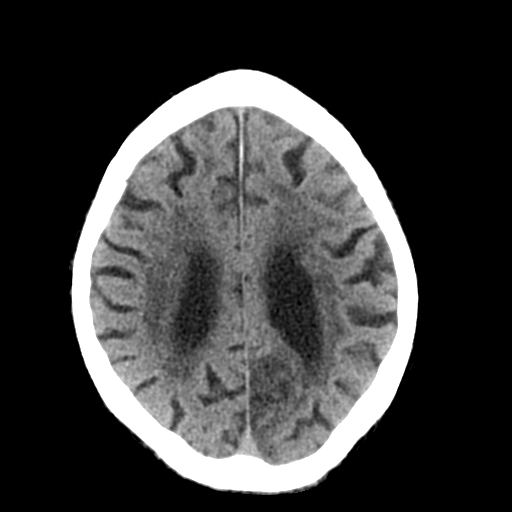

[Series 603: bone ------ · axial · 0.40mm/px · z∈[-71,-23]mm · 4 of 79 slices shown]
[im 8/79  bone]
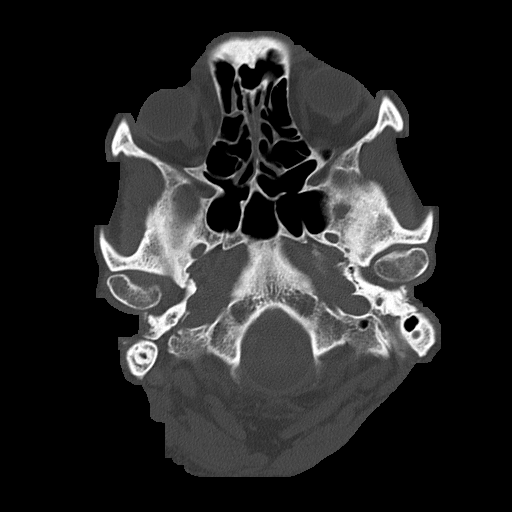
[im 16/79  bone]
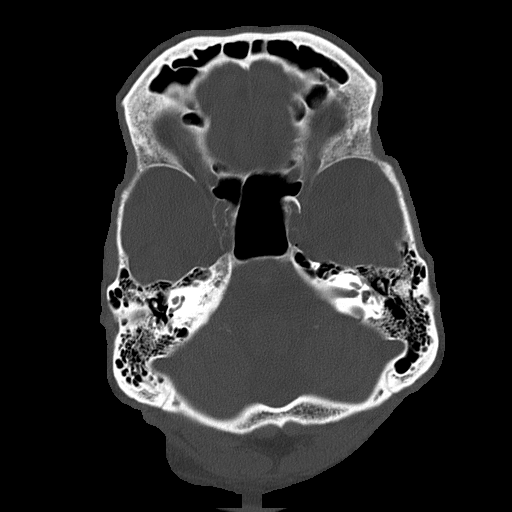
[im 24/79  bone]
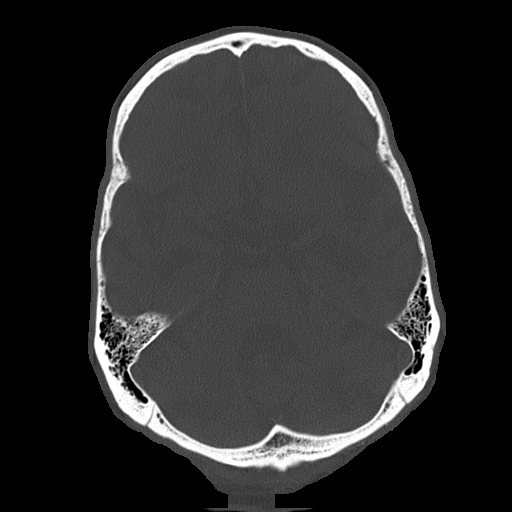
[im 32/79  bone]
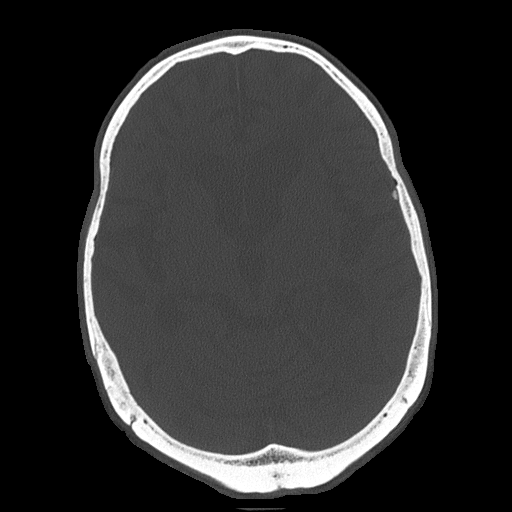

[16 of 30 positions shown; findings below may reference images not displayed]

FINDINGS: Brain: There is an acute or subacute evolving infarct at the left
occipital lobe, new from the prior study. There is no evidence of
hemorrhagic transformation. No mass lesion is seen.

There also appears to be a subacute right frontal subdural hematoma,
new from the prior study, measuring 9 mm in thickness. Prominence of
the ventricles and sulci reflects moderately severe cortical volume
loss. Diffuse periventricular and subcortical white matter change
likely reflects small vessel ischemic microangiopathy. Cerebellar
atrophy is noted, with underlying chronic cerebellar infarcts.

The brainstem and fourth ventricle are within normal limits. The
basal ganglia are unremarkable in appearance. No midline shift is
seen.

Vascular: No hyperdense vessel or unexpected calcification.

Skull: There is no evidence of fracture; visualized osseous
structures are unremarkable in appearance.

Sinuses/Orbits: The visualized portions of the orbits are within
normal limits. The paranasal sinuses and mastoid air cells are
well-aerated.

Other: No significant soft tissue abnormalities are seen.
IMPRESSION: 1. Acute or subacute evolving infarct at the left occipital lobe,
new from the prior study. No evidence of hemorrhagic transformation.
2. Subacute right frontal subdural hematoma, new from the prior
study, measuring 9 mm in thickness.
3. Moderately severe cortical volume loss and diffuse small vessel
ischemic microangiopathy.
4. Chronic cerebellar infarcts noted, with associated
encephalomalacia.

Critical Value/emergent results were called by telephone at the time
of interpretation on 09/10/2017 at [DATE] to Dr. ASHELY JIM, who
verbally acknowledged these results.
